# Patient Record
Sex: Male | Born: 1960 | Race: White | Hispanic: No | Smoking: Current every day smoker
Health system: Southern US, Community
[De-identification: ages and names within clinical notes are randomized; demographics above are authoritative.]

## PROBLEM LIST (undated history)

## (undated) DIAGNOSIS — C801 Malignant (primary) neoplasm, unspecified: Secondary | ICD-10-CM

## (undated) DIAGNOSIS — F1010 Alcohol abuse, uncomplicated: Secondary | ICD-10-CM

## (undated) DIAGNOSIS — K7460 Unspecified cirrhosis of liver: Secondary | ICD-10-CM

## (undated) HISTORY — PX: PARACENTESIS: SHX844

## (undated) HISTORY — PX: OTHER SURGICAL HISTORY: SHX169

## (undated) HISTORY — PX: KNEE SURGERY: SHX244

---

## 1998-04-24 ENCOUNTER — Emergency Department (HOSPITAL_COMMUNITY): Admission: EM | Admit: 1998-04-24 | Discharge: 1998-04-24 | Payer: Self-pay | Admitting: Emergency Medicine

## 1998-04-24 ENCOUNTER — Encounter: Payer: Self-pay | Admitting: Orthopedic Surgery

## 2003-03-07 ENCOUNTER — Emergency Department (HOSPITAL_COMMUNITY): Admission: EM | Admit: 2003-03-07 | Discharge: 2003-03-07 | Payer: Self-pay | Admitting: Emergency Medicine

## 2010-08-14 ENCOUNTER — Emergency Department (HOSPITAL_COMMUNITY)
Admission: EM | Admit: 2010-08-14 | Discharge: 2010-08-16 | Disposition: A | Discharge status: Home / Self Care | Payer: Self-pay | Attending: Emergency Medicine | Admitting: Emergency Medicine

## 2010-08-14 ENCOUNTER — Emergency Department (HOSPITAL_COMMUNITY): Payer: Self-pay

## 2010-08-14 DIAGNOSIS — I517 Cardiomegaly: Secondary | ICD-10-CM | POA: Insufficient documentation

## 2010-08-14 DIAGNOSIS — Z5900 Homelessness unspecified: Secondary | ICD-10-CM | POA: Insufficient documentation

## 2010-08-14 DIAGNOSIS — J4489 Other specified chronic obstructive pulmonary disease: Secondary | ICD-10-CM | POA: Insufficient documentation

## 2010-08-14 DIAGNOSIS — J449 Chronic obstructive pulmonary disease, unspecified: Secondary | ICD-10-CM | POA: Insufficient documentation

## 2010-08-14 DIAGNOSIS — R079 Chest pain, unspecified: Secondary | ICD-10-CM | POA: Insufficient documentation

## 2010-08-14 DIAGNOSIS — F1010 Alcohol abuse, uncomplicated: Secondary | ICD-10-CM | POA: Insufficient documentation

## 2010-08-14 DIAGNOSIS — R209 Unspecified disturbances of skin sensation: Secondary | ICD-10-CM | POA: Insufficient documentation

## 2010-08-14 LAB — CBC
HCT: 42.5 % (ref 39.0–52.0)
Hemoglobin: 14.6 g/dL (ref 13.0–17.0)
MCH: 32.8 pg (ref 26.0–34.0)
MCHC: 34.4 g/dL (ref 30.0–36.0)
MCV: 95.5 fL (ref 78.0–100.0)
Platelets: 140 10*3/uL — ABNORMAL LOW (ref 150–400)
RBC: 4.45 MIL/uL (ref 4.22–5.81)
RDW: 13.1 % (ref 11.5–15.5)
WBC: 5.9 10*3/uL (ref 4.0–10.5)

## 2010-08-14 LAB — DIFFERENTIAL
Basophils Absolute: 0.1 10*3/uL (ref 0.0–0.1)
Basophils Relative: 1 % (ref 0–1)
Eosinophils Absolute: 0 10*3/uL (ref 0.0–0.7)
Eosinophils Relative: 1 % (ref 0–5)
Lymphocytes Relative: 20 % (ref 12–46)
Lymphs Abs: 1.2 10*3/uL (ref 0.7–4.0)
Monocytes Absolute: 0.6 10*3/uL (ref 0.1–1.0)
Monocytes Relative: 10 % (ref 3–12)
Neutro Abs: 4 10*3/uL (ref 1.7–7.7)
Neutrophils Relative %: 68 % (ref 43–77)

## 2010-08-14 LAB — RAPID URINE DRUG SCREEN, HOSP PERFORMED
Amphetamines: NOT DETECTED
Barbiturates: NOT DETECTED
Benzodiazepines: NOT DETECTED
Cocaine: NOT DETECTED
Opiates: NOT DETECTED
Tetrahydrocannabinol: NOT DETECTED

## 2010-08-14 LAB — COMPREHENSIVE METABOLIC PANEL
ALT: 211 U/L — ABNORMAL HIGH (ref 0–53)
AST: 333 U/L — ABNORMAL HIGH (ref 0–37)
Albumin: 4 g/dL (ref 3.5–5.2)
Alkaline Phosphatase: 153 U/L — ABNORMAL HIGH (ref 39–117)
BUN: 5 mg/dL — ABNORMAL LOW (ref 6–23)
CO2: 20 mEq/L (ref 19–32)
Calcium: 9.4 mg/dL (ref 8.4–10.5)
Chloride: 99 mEq/L (ref 96–112)
Creatinine, Ser: 0.69 mg/dL (ref 0.50–1.35)
GFR calc Af Amer: >60 mL/min (ref >60)
GFR calc non Af Amer: >60 mL/min (ref >60)
Glucose, Bld: 96 mg/dL (ref 70–99)
Potassium: 3.8 mEq/L (ref 3.5–5.1)
Sodium: 134 mEq/L — ABNORMAL LOW (ref 135–145)
Total Bilirubin: 0.7 mg/dL (ref 0.3–1.2)
Total Protein: 8.1 g/dL (ref 6.0–8.3)

## 2010-08-14 LAB — ETHANOL: Alcohol, Ethyl (B): 301 mg/dL — ABNORMAL HIGH (ref 0–11)

## 2010-08-15 LAB — ETHANOL: Alcohol, Ethyl (B): <11 mg/dL (ref 0–11)

## 2010-08-19 ENCOUNTER — Emergency Department (HOSPITAL_COMMUNITY)
Admission: EM | Admit: 2010-08-19 | Discharge: 2010-08-20 | Disposition: A | Discharge status: Other Acute Inpatient Hospital | Payer: Self-pay | Attending: Emergency Medicine | Admitting: Emergency Medicine

## 2010-08-19 DIAGNOSIS — R45851 Suicidal ideations: Secondary | ICD-10-CM | POA: Insufficient documentation

## 2010-08-19 DIAGNOSIS — F1010 Alcohol abuse, uncomplicated: Secondary | ICD-10-CM | POA: Insufficient documentation

## 2010-08-19 LAB — RAPID URINE DRUG SCREEN, HOSP PERFORMED
Barbiturates: NOT DETECTED
Benzodiazepines: NOT DETECTED
Cocaine: NOT DETECTED
Opiates: NOT DETECTED
Tetrahydrocannabinol: NOT DETECTED

## 2010-08-19 LAB — CBC
HCT: 43.2 % (ref 39.0–52.0)
Hemoglobin: 14.9 g/dL (ref 13.0–17.0)
MCH: 33.2 pg (ref 26.0–34.0)
MCV: 96.2 fL (ref 78.0–100.0)
RBC: 4.49 MIL/uL (ref 4.22–5.81)
WBC: 6.8 10*3/uL (ref 4.0–10.5)

## 2010-08-19 LAB — COMPREHENSIVE METABOLIC PANEL
AST: 288 U/L — ABNORMAL HIGH (ref 0–37)
BUN: 5 mg/dL — ABNORMAL LOW (ref 6–23)
CO2: 22 mEq/L (ref 19–32)
Chloride: 102 mEq/L (ref 96–112)
Creatinine, Ser: 0.66 mg/dL (ref 0.50–1.35)
GFR calc Af Amer: >60 mL/min (ref >60)
GFR calc non Af Amer: >60 mL/min (ref >60)
Glucose, Bld: 95 mg/dL (ref 70–99)
Total Bilirubin: 0.7 mg/dL (ref 0.3–1.2)

## 2010-08-19 LAB — DIFFERENTIAL
Lymphocytes Relative: 25 % (ref 12–46)
Lymphs Abs: 1.7 10*3/uL (ref 0.7–4.0)
Monocytes Relative: 12 % (ref 3–12)
Neutrophils Relative %: 61 % (ref 43–77)

## 2010-08-19 LAB — URINALYSIS, ROUTINE W REFLEX MICROSCOPIC
Bilirubin Urine: NEGATIVE
Hgb urine dipstick: NEGATIVE
Nitrite: NEGATIVE
pH: 6 (ref 5.0–8.0)

## 2010-08-19 LAB — ETHANOL: Alcohol, Ethyl (B): 330 mg/dL — ABNORMAL HIGH (ref 0–11)

## 2010-08-20 ENCOUNTER — Inpatient Hospital Stay (HOSPITAL_COMMUNITY)
Admission: AD | Admit: 2010-08-20 | Discharge: 2010-08-24 | Disposition: A | Discharge status: Home / Self Care | Payer: PRIVATE HEALTH INSURANCE | Source: Ambulatory Visit | Attending: Psychiatry | Admitting: Psychiatry

## 2010-08-20 DIAGNOSIS — Z880 Allergy status to penicillin: Secondary | ICD-10-CM

## 2010-08-20 DIAGNOSIS — Z560 Unemployment, unspecified: Secondary | ICD-10-CM

## 2010-08-20 DIAGNOSIS — F1994 Other psychoactive substance use, unspecified with psychoactive substance-induced mood disorder: Secondary | ICD-10-CM

## 2010-08-20 DIAGNOSIS — Z5900 Homelessness unspecified: Secondary | ICD-10-CM

## 2010-08-20 DIAGNOSIS — F329 Major depressive disorder, single episode, unspecified: Secondary | ICD-10-CM

## 2010-08-20 DIAGNOSIS — G9349 Other encephalopathy: Secondary | ICD-10-CM

## 2010-08-20 DIAGNOSIS — F10951 Alcohol use, unspecified with alcohol-induced psychotic disorder with hallucinations: Secondary | ICD-10-CM

## 2010-08-20 DIAGNOSIS — M545 Low back pain: Secondary | ICD-10-CM

## 2010-08-20 DIAGNOSIS — F1020 Alcohol dependence, uncomplicated: Principal | ICD-10-CM

## 2010-08-20 DIAGNOSIS — R45851 Suicidal ideations: Secondary | ICD-10-CM

## 2010-08-20 DIAGNOSIS — F3289 Other specified depressive episodes: Secondary | ICD-10-CM

## 2010-08-20 DIAGNOSIS — F429 Obsessive-compulsive disorder, unspecified: Secondary | ICD-10-CM

## 2010-08-20 DIAGNOSIS — Z6379 Other stressful life events affecting family and household: Secondary | ICD-10-CM

## 2010-08-20 DIAGNOSIS — F1910 Other psychoactive substance abuse, uncomplicated: Secondary | ICD-10-CM

## 2010-08-20 DIAGNOSIS — K7010 Alcoholic hepatitis without ascites: Secondary | ICD-10-CM

## 2010-08-22 DIAGNOSIS — F1994 Other psychoactive substance use, unspecified with psychoactive substance-induced mood disorder: Secondary | ICD-10-CM

## 2010-08-22 DIAGNOSIS — F1020 Alcohol dependence, uncomplicated: Secondary | ICD-10-CM

## 2010-08-22 DIAGNOSIS — F1910 Other psychoactive substance abuse, uncomplicated: Secondary | ICD-10-CM

## 2010-08-24 ENCOUNTER — Inpatient Hospital Stay (HOSPITAL_COMMUNITY)
Admission: AD | Admit: 2010-08-24 | Discharge: 2010-09-03 | DRG: 896 | Disposition: A | Discharge status: Rehabilitation Facility | Payer: PRIVATE HEALTH INSURANCE | Source: Ambulatory Visit | Attending: Psychiatry | Admitting: Psychiatry

## 2010-08-25 LAB — HEPATIC FUNCTION PANEL
ALT: 197 U/L — ABNORMAL HIGH (ref 0–53)
Albumin: 4 g/dL (ref 3.5–5.2)
Alkaline Phosphatase: 107 U/L (ref 39–117)
Total Bilirubin: 0.9 mg/dL (ref 0.3–1.2)
Total Protein: 7.5 g/dL (ref 6.0–8.3)

## 2010-08-26 LAB — HEPATIC FUNCTION PANEL
ALT: 212 U/L — ABNORMAL HIGH (ref 0–53)
Alkaline Phosphatase: 108 U/L (ref 39–117)
Bilirubin, Direct: 0.2 mg/dL (ref 0.0–0.3)
Indirect Bilirubin: 0.5 mg/dL (ref 0.3–0.9)
Total Bilirubin: 0.7 mg/dL (ref 0.3–1.2)
Total Protein: 7.8 g/dL (ref 6.0–8.3)

## 2010-08-27 ENCOUNTER — Ambulatory Visit (HOSPITAL_COMMUNITY)
Admit: 2010-08-27 | Discharge: 2010-08-27 | Disposition: A | Discharge status: Home / Self Care | Payer: PRIVATE HEALTH INSURANCE | Attending: Psychiatry | Admitting: Psychiatry

## 2010-08-27 DIAGNOSIS — F29 Unspecified psychosis not due to a substance or known physiological condition: Secondary | ICD-10-CM | POA: Insufficient documentation

## 2010-08-27 DIAGNOSIS — R51 Headache: Secondary | ICD-10-CM | POA: Insufficient documentation

## 2010-08-27 LAB — HEPATIC FUNCTION PANEL
ALT: 230 U/L — ABNORMAL HIGH (ref 0–53)
AST: 191 U/L — ABNORMAL HIGH (ref 0–37)
Albumin: 3.8 g/dL (ref 3.5–5.2)
Bilirubin, Direct: 0.1 mg/dL (ref 0.0–0.3)
Indirect Bilirubin: 0.5 mg/dL (ref 0.3–0.9)
Total Bilirubin: 0.5 mg/dL (ref 0.3–1.2)
Total Protein: 7.5 g/dL (ref 6.0–8.3)
Total Protein: 7.3 g/dL (ref 6.0–8.3)

## 2010-08-27 LAB — AMMONIA: Ammonia: 45 umol/L (ref 11–60)

## 2010-08-27 MED ORDER — IOHEXOL 300 MG/ML  SOLN
100.0000 mL | Freq: Once | INTRAMUSCULAR | Status: AC | PRN
  Administered 2010-08-27: 100 mL via INTRAVENOUS

## 2010-08-29 LAB — APTT: aPTT: 36 seconds (ref 24–37)

## 2010-08-29 LAB — PROTIME-INR
INR: 0.94 (ref 0.00–1.49)
Prothrombin Time: 12.8 seconds (ref 11.6–15.2)

## 2010-08-30 LAB — HEPATIC FUNCTION PANEL
AST: 110 U/L — ABNORMAL HIGH (ref 0–37)
Albumin: 3.7 g/dL (ref 3.5–5.2)
Total Bilirubin: 0.5 mg/dL (ref 0.3–1.2)
Total Protein: 7.4 g/dL (ref 6.0–8.3)

## 2010-08-31 LAB — HEPATIC FUNCTION PANEL
AST: 97 U/L — ABNORMAL HIGH (ref 0–37)
Albumin: 3.9 g/dL (ref 3.5–5.2)
Alkaline Phosphatase: 97 U/L (ref 39–117)
Total Bilirubin: 0.4 mg/dL (ref 0.3–1.2)
Total Protein: 7.3 g/dL (ref 6.0–8.3)

## 2010-08-31 LAB — HEPATITIS PANEL, ACUTE
Hep B C IgM: NEGATIVE
Hepatitis B Surface Ag: NEGATIVE

## 2010-09-03 LAB — HEPATIC FUNCTION PANEL
ALT: 149 U/L — ABNORMAL HIGH (ref 0–53)
Albumin: 4.1 g/dL (ref 3.5–5.2)
Indirect Bilirubin: 0.4 mg/dL (ref 0.3–0.9)
Total Protein: 7.3 g/dL (ref 6.0–8.3)

## 2010-09-06 NOTE — Discharge Summary (Signed)
[name], [name] NO.:  [id number]  MEDICAL RECORD NO.:  [id number]  LOCATION:  0305                          FACILITY:  [facility]  PHYSICIAN:  Orson Aloe, MD       DATE OF BIRTH:  1960/09/09  DATE OF ADMISSION:  08/20/2010 DATE OF DISCHARGE:  09/03/2010                              DISCHARGE SUMMARY   REASON FOR HOSPITALIZATION:  This 50 year old married white male is not sure in fact if he is divorced or not.  Apparently he is homeless and bums money.  [place] He contacted mobile crisis stating that he needed help for finding a place for detox.  He was met at [facility]. He reported increasing depressive symptoms such as obsessing over past and thinking about things he regrets.  He is tired of being drunk all the time.  He states that if he did not find help, he might kill himself overnight.  He admitted to not caring about his life and wanting to be dead.  He states he will feel better if he was dead.  His admitting alcohol level was 330.  Positive physical findings:  He has no abnormalities of the CBC. His SGOT was elevated at 288 as expected and SGPT was 266.  His UDS was negative.  No other labs were drawn.  DISCHARGE DIAGNOSIS:  Axis I:  Alcohol hallucinosis, inhalant abuse, alcohol abuse, history of obsessive compulsive disorder, substance- induced mood disorder. Axis II: Deferred. 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 Axis IV:  Severe, housing, occupational and financial. Axis V:  45.  SIGNIFICANT FINDINGS/HOSPITAL COURSE:  He had some clearing that seemed associated with him starting on some Risperdal one day but then he had further difficulty with his mentation.  It was associated at a time in which his liver enzymes went from 141 and 197 up to 191 and 256 from August 4 to August 6.  Consultation with GI recommended that since it was below 300,  we would not worry too much about it but get PT and PTT which was in the normal range as well as ammonia which was 45, also in the normal range.  On discharge his SGOT was 103 and SGPT was 149.  It was felt when they both got below 100, that he probably would benefit from being tried on Tegretol.  During hospital stay, he was able to recognize several triggers, one of which was that when he goes to [place], he wants to drink and he became very belligerent and cursing when he was forced to attend one karaoke session and at the time of the next karaoke session, he asked to be relieved of that and an order was written for him to not have to go.  Furthermore, during the hospital course, a CT of the head with and without contrast revealed no acute pathology.  He also on his own asked for a big book and started reading that himself.  He did start some Celexa 20 mg for depression and OCD with some benefit noted.  Seroquel 200 mg for sleep was ordered and he did  seem to sleep better with that. Ensure seem to help him be able to reestablished some good nutrition during hospital stay.  CONDITION ON DISCHARGE:  The patient is seen today, felt ready for [facility] program.  Denies any acute auditory or visualizations, suicidal intent or homicidal intent.  No psychoses.  He sees alcohol as his issue and needs help with that.  DISCHARGE STATUS:  Improved.  INSTRUCTIONS:  Resume typical activity and diet.  Attend [facility] on Monday morning August 13 and a door-to- door cab was arranged for him as he knew he was not able to make it past the first gas station.  DISCHARGE MEDICATIONS: 1. Celexa 20 mg a day. 2. Nicotine patch 21 mg a day. 3. Protonix 40 mg a day. 4. Seroquel 200  mg at bedtime. 5. Vitamin B1 100. 6. Seroquel 25 mg twice a day.          ______________________________ Orson Aloe, MD     EW/MEDQ  D:  09/04/2010  T:  09/05/2010  Job:  [id number]  Electronically  Signed by Orson Aloe  on 09/06/2010 12:14:51 PM

## 2010-10-06 ENCOUNTER — Other Ambulatory Visit: Payer: Self-pay

## 2010-10-06 ENCOUNTER — Emergency Department (INDEPENDENT_AMBULATORY_CARE_PROVIDER_SITE_OTHER): Payer: Self-pay

## 2010-10-06 ENCOUNTER — Emergency Department (HOSPITAL_BASED_OUTPATIENT_CLINIC_OR_DEPARTMENT_OTHER)
Admission: EM | Admit: 2010-10-06 | Discharge: 2010-10-07 | Disposition: A | Discharge status: Home / Self Care | Payer: Self-pay | Attending: Emergency Medicine | Admitting: Emergency Medicine

## 2010-10-06 ENCOUNTER — Encounter: Payer: Self-pay | Admitting: *Deleted

## 2010-10-06 DIAGNOSIS — R059 Cough, unspecified: Secondary | ICD-10-CM | POA: Insufficient documentation

## 2010-10-06 DIAGNOSIS — R188 Other ascites: Secondary | ICD-10-CM

## 2010-10-06 DIAGNOSIS — T148XXA Other injury of unspecified body region, initial encounter: Secondary | ICD-10-CM

## 2010-10-06 DIAGNOSIS — K8020 Calculus of gallbladder without cholecystitis without obstruction: Secondary | ICD-10-CM

## 2010-10-06 DIAGNOSIS — F17200 Nicotine dependence, unspecified, uncomplicated: Secondary | ICD-10-CM | POA: Insufficient documentation

## 2010-10-06 DIAGNOSIS — J984 Other disorders of lung: Secondary | ICD-10-CM

## 2010-10-06 DIAGNOSIS — R079 Chest pain, unspecified: Secondary | ICD-10-CM | POA: Insufficient documentation

## 2010-10-06 DIAGNOSIS — R05 Cough: Secondary | ICD-10-CM

## 2010-10-06 DIAGNOSIS — M25519 Pain in unspecified shoulder: Secondary | ICD-10-CM | POA: Insufficient documentation

## 2010-10-06 HISTORY — DX: Alcohol abuse, uncomplicated: F10.10

## 2010-10-06 HISTORY — DX: Malignant (primary) neoplasm, unspecified: C80.1

## 2010-10-06 MED ORDER — NITROGLYCERIN 0.4 MG SL SUBL
0.4000 mg | SUBLINGUAL_TABLET | SUBLINGUAL | Status: DC | PRN
  Filled 2010-10-06: qty 25

## 2010-10-06 MED ORDER — ASPIRIN 325 MG PO TABS
325.0000 mg | ORAL_TABLET | ORAL | Status: AC
  Administered 2010-10-07: 325 mg via ORAL
  Filled 2010-10-06: qty 1

## 2010-10-06 NOTE — ED Notes (Signed)
Pt brought to ED via EMS from [facility]. C/O midsternal CP radiating to left shoulder onset yesterday. "Hard to get a deep breath". Also states he has had a cough. Denies other s/s.

## 2010-10-06 NOTE — ED Provider Notes (Addendum)
Scribed for Phillip Baker, MD, the patient was seen in room MH05/MH05 . This chart was scribed by Ellie Lunch. This patient's care was started at 11:45 PM.   CSN: [id number] Arrival date & time: 10/06/2010 10:44 PM   Chief Complaint  Patient presents with  . Chest Pain    (Include location/radiation/quality/duration/timing/severity/associated sxs/prior treatment) HPI [name] is a 50 y.o. male who presents to the Emergency Department complaining of constant left chest pain starting last night that began to radiate to left shoulder and arm this morning. Pt was watching tv last night when pain onset. Denies SOB, diaphoresis. C/o associated dry cough. Shoulder and arm pain aggravated by arm movement and position. Improvement on rest. Chest pain is improved by nothing.  Pt is at [facility] for alcohol detox for the last 40 days.  No reported Heart problems.   Past Medical History  Diagnosis Date  . Alcohol abuse   . Cancer     Past Surgical History  Procedure Date  . Arm surgery   . Knee surgery     History reviewed. No pertinent family history.  History  Substance Use Topics  . Smoking status: Current Everyday Smoker  . Smokeless tobacco: Not on file  . Alcohol Use: Yes    Review of Systems 10 Systems reviewed and are negative for acute change except as noted in the HPI.   Allergies  Penicillins  Home Medications   Current Outpatient Rx  Name Route Sig Dispense Refill  . CITALOPRAM HYDROBROMIDE 20 MG PO TABS Oral Take 20 mg by mouth daily.      Marland Kitchen ONE-DAILY MULTI VITAMINS PO TABS Oral Take 1 tablet by mouth daily.      Marland Kitchen RISPERIDONE 3 MG PO TABS Oral Take 3 mg by mouth at bedtime.      . THIAMINE HCL 100 MG PO TABS Oral Take 100 mg by mouth daily.      . TRAZODONE HCL 100 MG PO TABS Oral Take 200 mg by mouth at bedtime.        Physical Exam    BP 150/76  Pulse 72  Temp(Src) 98.5 F (36.9 C) (Oral)  Resp 20  Ht 5\' 7"  (1.702 m)  Wt 180 lb (81.647 kg)   BMI 28.19 kg/m2  SpO2 100%  Physical Exam  Nursing note and vitals reviewed. Constitutional: He is oriented to person, place, and time. He appears well-developed and well-nourished.  HENT:  Head: Normocephalic and atraumatic.  Eyes: Conjunctivae and EOM are normal.  Neck: Normal range of motion. Neck supple.  Cardiovascular: Normal rate, regular rhythm and normal heart sounds.   Pulmonary/Chest: Effort normal. He has wheezes (bilateral faint expiratory). He exhibits no tenderness.  Abdominal: Soft. There is no tenderness.  Musculoskeletal: Normal range of motion.       Left upper extremity tenderness with ROM.   Neurological: He is alert and oriented to person, place, and time.  Skin: Skin is warm and dry. No rash noted.   Procedures  OTHER DATA REVIEWED: Nursing notes, vital signs, and past medical records reviewed.  DIAGNOSTIC STUDIES: Oxygen Saturation is 100% on room air, normal by my interpretation.    LABS / RADIOLOGY:  Dg Chest 2 View  10/07/2010  *RADIOLOGY REPORT*  Clinical Data: Constant left-sided chest pain, radiating to the left shoulder and arm.  Dry cough.  CHEST - 2 VIEW  Comparison: Chest radiograph performed 08/14/2010  Findings: The lungs are well-aerated and clear.  There is no evidence  of focal opacification, pleural effusion or pneumothorax.  The heart is borderline normal in size; the mediastinal contour is within normal limits.  No acute osseous abnormalities are seen. The visualized portions of the left shoulder are grossly unremarkable.  IMPRESSION: No acute cardiopulmonary process seen; no displaced rib fractures identified.  Original Report Authenticated By: Tonia Ghent, M.D.   Ct Angio Chest W/cm &/or Wo Cm  10/07/2010  *RADIOLOGY REPORT*  Clinical Data:  Constant left-sided chest pain, radiating to the left shoulder and arm.  Dry cough.  CT ANGIOGRAPHY CHEST WITH CONTRAST  Technique:  Multidetector CT imaging of the chest was performed using the standard  protocol during bolus administration of intravenous contrast.  Multiplanar CT image reconstructions including MIPs were obtained to evaluate the vascular anatomy.  Contrast:  80 mL of Omnipaque 350 IV contrast  Comparison:  Chest radiograph performed earlier today at 12:52 a.m.  Findings:  There is no evidence of pulmonary embolus.  A centrally calcified 1.0 cm nodule is noted at the medial aspect of the right lung apex (image 36 of 106); this is likely benign given its appearance.  Additional tiny nodules are seen within both lungs, measuring up to 5 mm in size (images 43, 50, 64 and 68); these are most likely post infectious in nature.  The lungs are otherwise clear.  There is no evidence of significant focal consolidation, pleural effusion or pneumothorax.  No masses are identified; no abnormal focal contrast enhancement is seen.  No mediastinal lymphadenopathy is seen. There is incidental direct origin of the left vertebral artery from the aortic arch.  The great vessels are unremarkable in appearance.  No pericardial effusion is seen.  No axillary lymphadenopathy is seen.  The thyroid gland is unremarkable in appearance.  The visualized portions of the liver and spleen are unremarkable. Apparent stones are noted at the base of the minimally visualized gallbladder.  No acute osseous abnormalities are seen.  Review of the MIP images confirms the above findings.  IMPRESSION:  1.  No evidence of pulmonary embolus. 2.  Scattered tiny nodules within both lungs, measuring up to 5 mm in size, are most likely post infectious in nature. 3.  Centrally calcified 1.0 cm nodule at the medial aspect of the right lung apex is likely benign in its appearance, and likely reflects prior granulomatous disease. 4.  Apparent cholelithiasis, though the gallbladder is only minimally visualized.  Original Report Authenticated By: Tonia Ghent, M.D.     ED COURSE / COORDINATION OF CARE: Results for orders placed during the  hospital encounter of 10/06/10  CBC      Component Value Range   WBC 5.1  4.0 - 10.5 (K/uL)   RBC 3.97 (*) 4.22 - 5.81 (MIL/uL)   Hemoglobin 13.1  13.0 - 17.0 (g/dL)   HCT 04.5 (*) 40.9 - 52.0 (%)   MCV 93.2  78.0 - 100.0 (fL)   MCH 33.0  26.0 - 34.0 (pg)   MCHC 35.4  30.0 - 36.0 (g/dL)   RDW 81.1 (*) 91.4 - 15.5 (%)   Platelets 103 (*) 150 - 400 (K/uL)  BASIC METABOLIC PANEL      Component Value Range   Sodium 137  135 - 145 (mEq/L)   Potassium 3.9  3.5 - 5.1 (mEq/L)   Chloride 102  96 - 112 (mEq/L)   CO2 25  19 - 32 (mEq/L)   Glucose, Bld 109 (*) 70 - 99 (mg/dL)   BUN 8  6 - 23 (mg/dL)  Creatinine, Ser 0.80  0.50 - 1.35 (mg/dL)   Calcium 95.2  8.4 - 10.5 (mg/dL)   GFR calc non Af Amer >60  >60 (mL/min)   GFR calc Af Amer >60  >60 (mL/min)  CARDIAC PANEL(CRET KIN+CKTOT+MB+TROPI)      Component Value Range   Total CK 160  7 - 232 (U/L)   CK, MB 1.7  0.3 - 4.0 (ng/mL)   Troponin I <0.30  <0.30 (ng/mL)   Relative Index 1.1  0.0 - 2.5   D-DIMER, QUANTITATIVE      Component Value Range   D-Dimer, Quant 1.00 (*) 0.00 - 0.48 (ug/mL-FEU)  TROPONIN I      Component Value Range   Troponin I <0.30  <0.30 (ng/mL)      MDM:  Date: 10/07/2010  Rate: 72  Rhythm: sinus arrhythmia  QRS Axis: normal  Intervals: normal  ST/T Wave abnormalities: normal  Conduction Disutrbances:none  Narrative Interpretation:   Old EKG Reviewed: none available 3:31 AM Pt with neg chest ct ( d-dimer was elevated), will check delta trop and if neg, d/c--no concern for acs at this time  Patient given Toradol and now feels better Patient to be [facility] to [facility]  SCRIBE ATTESTATION: I personally performed the services described in this documentation, which was scribed in my presence. The recorded information has been reviewed and considered. Phillip Baker, MD            Phillip Baker, MD 10/07/10 8413  Phillip Baker, MD 10/07/10 0430

## 2010-10-06 NOTE — ED Notes (Signed)
See previous note

## 2010-10-07 ENCOUNTER — Emergency Department (INDEPENDENT_AMBULATORY_CARE_PROVIDER_SITE_OTHER): Payer: Self-pay

## 2010-10-07 DIAGNOSIS — R05 Cough: Secondary | ICD-10-CM

## 2010-10-07 DIAGNOSIS — R188 Other ascites: Secondary | ICD-10-CM

## 2010-10-07 LAB — CBC
Hemoglobin: 13.1 g/dL (ref 13.0–17.0)
MCH: 33 pg (ref 26.0–34.0)
Platelets: 103 10*3/uL — ABNORMAL LOW (ref 150–400)
RBC: 3.97 MIL/uL — ABNORMAL LOW (ref 4.22–5.81)
WBC: 5.1 10*3/uL (ref 4.0–10.5)

## 2010-10-07 LAB — D-DIMER, QUANTITATIVE: D-Dimer, Quant: 1 ug/mL-FEU — ABNORMAL HIGH (ref 0.00–0.48)

## 2010-10-07 LAB — CARDIAC PANEL(CRET KIN+CKTOT+MB+TROPI)
CK, MB: 1.7 ng/mL (ref 0.3–4.0)
Relative Index: 1.1 (ref 0.0–2.5)
Total CK: 160 U/L (ref 7–232)
Troponin I: <0.3 ng/mL (ref <0.30)

## 2010-10-07 LAB — BASIC METABOLIC PANEL
CO2: 25 mEq/L (ref 19–32)
Calcium: 10.1 mg/dL (ref 8.4–10.5)
Chloride: 102 mEq/L (ref 96–112)
Glucose, Bld: 109 mg/dL — ABNORMAL HIGH (ref 70–99)
Sodium: 137 mEq/L (ref 135–145)

## 2010-10-07 LAB — TROPONIN I: Troponin I: <0.3 ng/mL (ref <0.30)

## 2010-10-07 MED ORDER — KETOROLAC TROMETHAMINE 30 MG/ML IJ SOLN
30.0000 mg | Freq: Once | INTRAMUSCULAR | Status: AC
  Administered 2010-10-07: 30 mg via INTRAVENOUS
  Filled 2010-10-07: qty 1

## 2010-10-07 MED ORDER — IOHEXOL 350 MG/ML SOLN
80.0000 mL | Freq: Once | INTRAVENOUS | Status: AC | PRN
  Administered 2010-10-07: 80 mL via INTRAVENOUS

## 2010-10-07 NOTE — ED Notes (Signed)
EKG completed at 2252 9/15

## 2010-10-07 NOTE — ED Notes (Signed)
Pt to xray and returned
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
## 2011-03-17 ENCOUNTER — Emergency Department (HOSPITAL_COMMUNITY): Payer: Self-pay

## 2011-03-17 ENCOUNTER — Encounter (HOSPITAL_COMMUNITY): Payer: Self-pay | Admitting: *Deleted

## 2011-03-17 ENCOUNTER — Other Ambulatory Visit: Payer: Self-pay

## 2011-03-17 ENCOUNTER — Emergency Department (HOSPITAL_COMMUNITY)
Admission: EM | Admit: 2011-03-17 | Discharge: 2011-03-18 | Disposition: A | Discharge status: Home / Self Care | Payer: Self-pay | Attending: Emergency Medicine | Admitting: Emergency Medicine

## 2011-03-17 DIAGNOSIS — F17200 Nicotine dependence, unspecified, uncomplicated: Secondary | ICD-10-CM | POA: Insufficient documentation

## 2011-03-17 DIAGNOSIS — Z79899 Other long term (current) drug therapy: Secondary | ICD-10-CM | POA: Insufficient documentation

## 2011-03-17 DIAGNOSIS — R079 Chest pain, unspecified: Secondary | ICD-10-CM | POA: Insufficient documentation

## 2011-03-17 DIAGNOSIS — F1020 Alcohol dependence, uncomplicated: Secondary | ICD-10-CM

## 2011-03-17 DIAGNOSIS — F10229 Alcohol dependence with intoxication, unspecified: Secondary | ICD-10-CM | POA: Insufficient documentation

## 2011-03-17 LAB — COMPREHENSIVE METABOLIC PANEL
AST: 72 U/L — ABNORMAL HIGH (ref 0–37)
Alkaline Phosphatase: 78 U/L (ref 39–117)
BUN: 5 mg/dL — ABNORMAL LOW (ref 6–23)
CO2: 20 mEq/L (ref 19–32)
Chloride: 101 mEq/L (ref 96–112)
Creatinine, Ser: 0.77 mg/dL (ref 0.50–1.35)
GFR calc non Af Amer: >90 mL/min (ref >90)
Total Bilirubin: 0.4 mg/dL (ref 0.3–1.2)

## 2011-03-17 LAB — CBC
HCT: 49 % (ref 39.0–52.0)
MCV: 93.5 fL (ref 78.0–100.0)
RBC: 5.24 MIL/uL (ref 4.22–5.81)
WBC: 9.7 10*3/uL (ref 4.0–10.5)

## 2011-03-17 LAB — RAPID URINE DRUG SCREEN, HOSP PERFORMED: Barbiturates: NOT DETECTED

## 2011-03-17 LAB — URINALYSIS, ROUTINE W REFLEX MICROSCOPIC
Glucose, UA: NEGATIVE mg/dL
Hgb urine dipstick: NEGATIVE
Specific Gravity, Urine: 1.005 (ref 1.005–1.030)
pH: 6 (ref 5.0–8.0)

## 2011-03-17 LAB — AMMONIA: Ammonia: 22 umol/L (ref 11–60)

## 2011-03-17 LAB — ETHANOL: Alcohol, Ethyl (B): 247 mg/dL — ABNORMAL HIGH (ref 0–11)

## 2011-03-17 MED ORDER — ONDANSETRON HCL 8 MG PO TABS: 4.0000 mg | ORAL_TABLET | Freq: Three times a day (TID) | ORAL | Status: DC | PRN

## 2011-03-17 MED ORDER — ACETAMINOPHEN 325 MG PO TABS: 650.0000 mg | ORAL_TABLET | ORAL | Status: DC | PRN

## 2011-03-17 MED ORDER — LISINOPRIL 10 MG PO TABS
10.0000 mg | ORAL_TABLET | Freq: Every day | ORAL | Status: DC
  Filled 2011-03-17: qty 1

## 2011-03-17 MED ORDER — CITALOPRAM HYDROBROMIDE 20 MG PO TABS
20.0000 mg | ORAL_TABLET | Freq: Every day | ORAL | Status: DC
  Filled 2011-03-17: qty 1

## 2011-03-17 MED ORDER — QUETIAPINE FUMARATE 100 MG PO TABS
100.0000 mg | ORAL_TABLET | Freq: Three times a day (TID) | ORAL | Status: DC
  Filled 2011-03-17 (×3): qty 1

## 2011-03-17 MED ORDER — IBUPROFEN 200 MG PO TABS: 600.0000 mg | ORAL_TABLET | Freq: Three times a day (TID) | ORAL | Status: DC | PRN

## 2011-03-17 MED ORDER — RISPERIDONE 3 MG PO TABS
3.0000 mg | ORAL_TABLET | Freq: Every day | ORAL | Status: DC
  Filled 2011-03-17: qty 1

## 2011-03-17 NOTE — ED Notes (Signed)
The pt is here for multiple complaints. He admits toi drinking alcohol and he is c/o chest pain and he wants to be committed laughing joking

## 2011-03-17 NOTE — ED Notes (Signed)
Pt report given and care endorsed to Damon, RN 

## 2011-03-17 NOTE — ED Provider Notes (Addendum)
History     CSN: [id number]  Arrival date & time 03/17/11  2045   First MD Initiated Contact with Patient 03/17/11 2115      Chief Complaint  Patient presents with  . Psychiatric Evaluation   level V caveat due to intoxication.  (Consider location/radiation/quality/duration/timing/severity/associated sxs/prior treatment) The history is provided by the patient.   patient wants to go for behavioral health. He states he's been drinking. He also states he has some chest pain.Marland Kitchen He states that he has a lot of reasons for behavioral health. He states he'll go her there and lie in the parking lot. He appears intoxicated  Past Medical History  Diagnosis Date  . Alcohol abuse   . Cancer     Past Surgical History  Procedure Date  . Arm surgery   . Knee surgery     History reviewed. No pertinent family history.  History  Substance Use Topics  . Smoking status: Current Everyday Smoker  . Smokeless tobacco: Not on file  . Alcohol Use: Yes      Review of Systems  Unable to perform ROS: Psychiatric disorder    Allergies  Penicillins  Home Medications   Current Outpatient Rx  Name Route Sig Dispense Refill  . CITALOPRAM HYDROBROMIDE 20 MG PO TABS Oral Take 20 mg by mouth daily.     Marland Kitchen LISINOPRIL 10 MG PO TABS Oral Take 10 mg by mouth daily.    . QUETIAPINE FUMARATE 100 MG PO TABS Oral Take 100 mg by mouth 3 (three) times daily.    Marland Kitchen RISPERIDONE 3 MG PO TABS Oral Take 3 mg by mouth at bedtime.        BP 141/99  Pulse 94  Temp(Src) 97.3 F (36.3 C) (Oral)  Resp 20  SpO2 97%  Physical Exam  Nursing note and vitals reviewed. Constitutional: He appears well-developed and well-nourished.  HENT:  Head: Normocephalic and atraumatic.  Eyes: EOM are normal. Pupils are equal, round, and reactive to light.  Neck: Normal range of motion. Neck supple.  Cardiovascular: Normal rate, regular rhythm and normal heart sounds.   No murmur heard. Pulmonary/Chest: Effort normal.     Mildly harsh breath sounds throughout  Abdominal: Soft. Bowel sounds are normal. He exhibits no distension and no mass. There is no tenderness. There is no rebound and no guarding.  Musculoskeletal: Normal range of motion. He exhibits no edema.  Neurological: He is alert. No cranial nerve deficit.       Patient appears intoxicated  Skin: Skin is warm and dry.  Psychiatric: He has a normal mood and affect.    ED Course  Procedures (including critical care time)  Labs Reviewed  URINALYSIS, ROUTINE W REFLEX MICROSCOPIC - Abnormal; Notable for the following:    Color, Urine STRAW (*)    Leukocytes, UA SMALL (*)    All other components within normal limits  URINE MICROSCOPIC-ADD ON  URINE RAPID DRUG SCREEN (HOSP PERFORMED)  CBC  COMPREHENSIVE METABOLIC PANEL  ETHANOL  AMMONIA   No results found.   No diagnosis found.   Date: 03/17/2011  Rate: 80   Rhythm: normal sinus rhythm  QRS Axis: normal  Intervals: normal  ST/T Wave abnormalities: normal  Conduction Disutrbances: none  Narrative Interpretation:   Old EKG Reviewed: unchanged No significant changes noted    MDM  Patient states he wants to go to behavioral health. He's been drinking. He won't say why he wants to go. He states he has many reasons. His  EKG is normal. Is also complaining of chest pain. His been involuntarily committed since he stated that he would grab the police officer's gun distally to go to behavioral health. He appears intoxicated this time. She'll need to be evaluated after he is sober.  Juliet Rude. Rubin Payor, MD 03/17/11 2332  Juliet Rude. Rubin Payor, MD 03/17/11 [phone]

## 2011-03-17 NOTE — ED Notes (Signed)
Pt states that he is trying to get to [facility]. Reports suicidal ideation with a plan to "get that police officer's gun right there and blow my brains out."  Informed pt that he was in the right place and that the doctor would have to see him and get some bloodwork completed.  Asked pt to change into paper scrubs and he said that he did not like them.  Pt laughing.  Informed pt again that we needed him to change into paper scrubs per protocol and that we could get him some help.  Pt walked out of room and said he needed to find the exit.  Pt continues to walk.  Redirected pt to room 18 and he would not go into room.  States again that he needed exit.  [employer] officer, Consulting civil engineer, security, EDP, and multiple staff members talking to pt in CDU as he was trying to leave.  Dr. Rubin Payor spoke with pt and pt sent back to room 18.

## 2011-03-18 ENCOUNTER — Encounter (HOSPITAL_COMMUNITY): Payer: Self-pay | Admitting: *Deleted

## 2011-03-18 MED ORDER — ZIPRASIDONE MESYLATE 20 MG IM SOLR: 20.0000 mg | INTRAMUSCULAR | Status: DC | PRN

## 2011-03-18 NOTE — ED Notes (Signed)
Pt to shower room on 2500 with security and sitter.

## 2011-03-18 NOTE — ED Notes (Signed)
Patient was offered another set for paper scrubs (since his are ripped), but the patient refuses to answer.

## 2011-03-18 NOTE — ED Notes (Signed)
Pt report given and care endorsed to Damon, RN

## 2011-03-18 NOTE — ED Notes (Signed)
Pt requesting to take his own medication so he isn't charged for meds from the hospital. Per Deretha Emory MD, ok for patient to take home medications. Pt medicated with 10mg  lisinopril. Pt refusing to take celexa until discharged.

## 2011-03-18 NOTE — ED Notes (Signed)
Pt d/c'd with belongings. Stable gait to d/c window. Pt verbalized understanding of d/c instructions prior to leaving. Sitter released.

## 2011-03-18 NOTE — ED Notes (Signed)
Pt moved room 28 for telepsych consult. Pt denies suicidal or homicidal ideation to this RN. Pt with no complaints at this time.

## 2011-03-18 NOTE — ED Provider Notes (Signed)
Patient seen and interviewed by telemedicine psychiatry Dr. Blair Hailey her has cleared the patient DC'd his IVC patient is no longer suicidal can be discharged home.    Phillip Jakes, MD 03/18/11 [phone]

## 2011-03-18 NOTE — ED Notes (Signed)
Patient is refusing to take his oral meds.  Advised MD.  MD stated he was okay with this.  MD ordered Geodon in case the patient becomes aggressive.

## 2011-03-18 NOTE — ED Notes (Signed)
Patient was asked if he wants to take his meds so he can sleep, but he refuses toanswer.
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
## 2013-07-11 IMAGING — CT CT HEAD WO/W CM
1 of 3 series · 13 of 30 positions shown, 17 images · IV contrast (APPLIED)
Comparison: None.

CLINICAL DATA: Confusion.  Headache.

CT HEAD WITHOUT AND WITH CONTRAST
TECHNIQUE: Contiguous axial images were obtained from the base of
the skull through the vertex without and with intravenous contrast.
Contrast: 100 ml Dmnipaque-THH

[Series 6: head_seq 4.5 h37s st · axial · 0.43mm/px · z∈[+1190,+1325]mm · 13 of 36 slices shown, 17 images]
[im 3/36  brain]
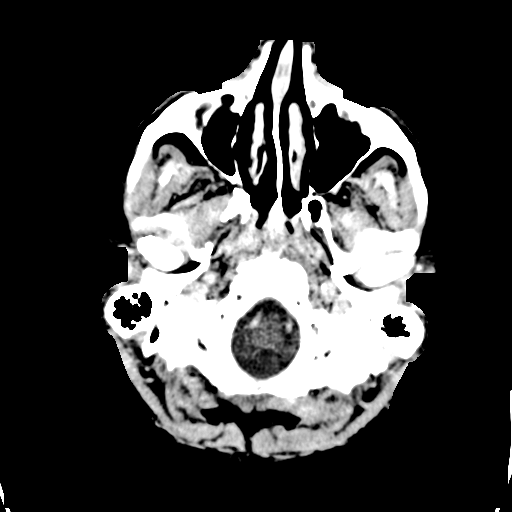
[im 3/36  bone]
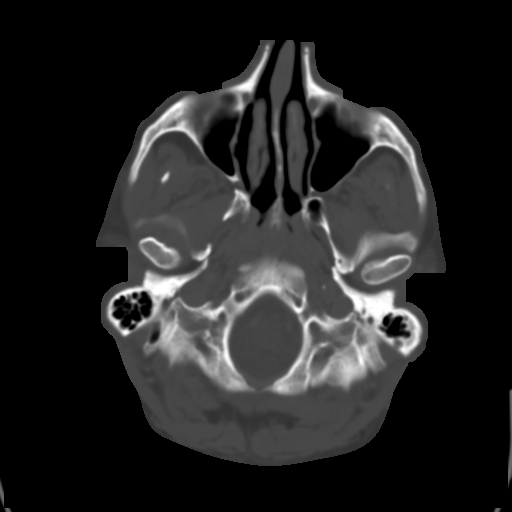
[im 6/36  brain]
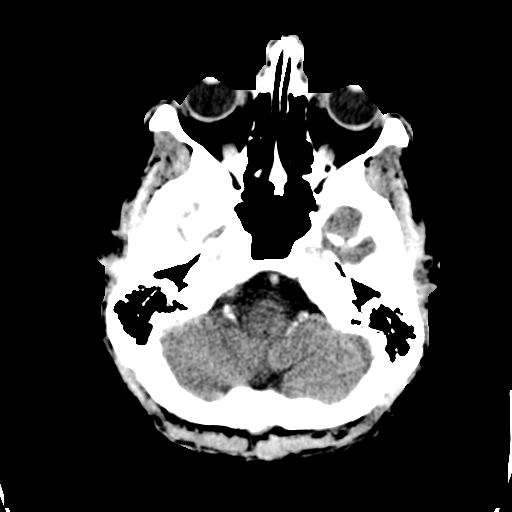
[im 8/36  brain]
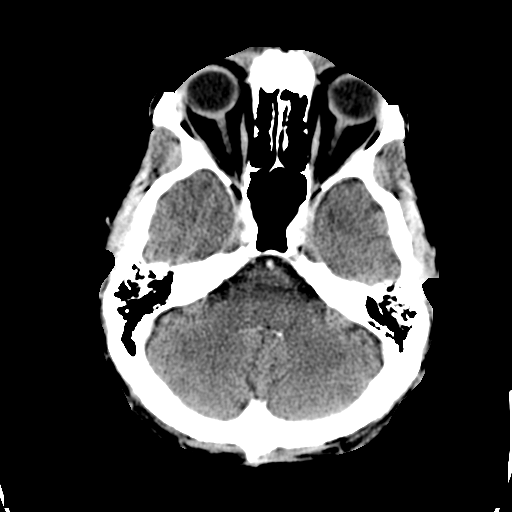
[im 11/36  brain]
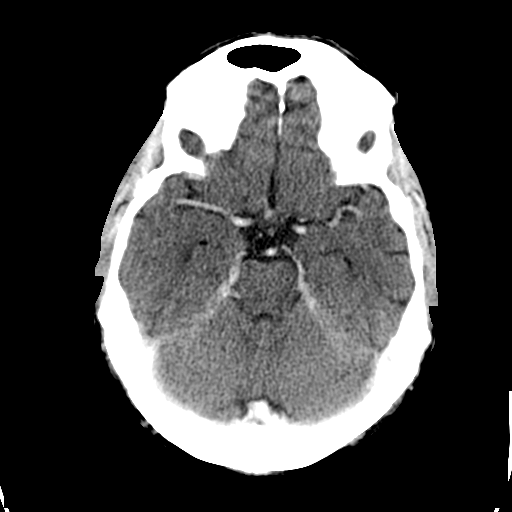
[im 13/36  brain]
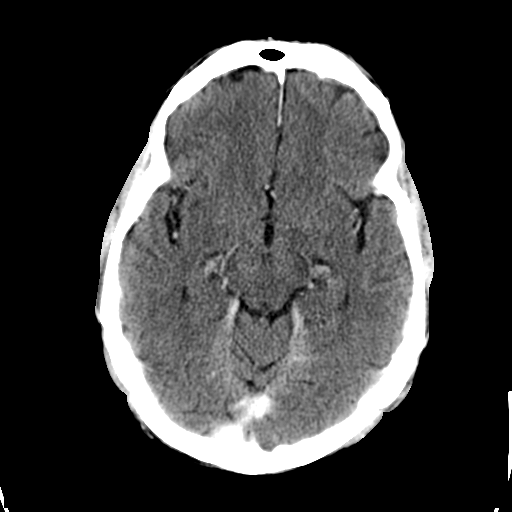
[im 13/36  bone]
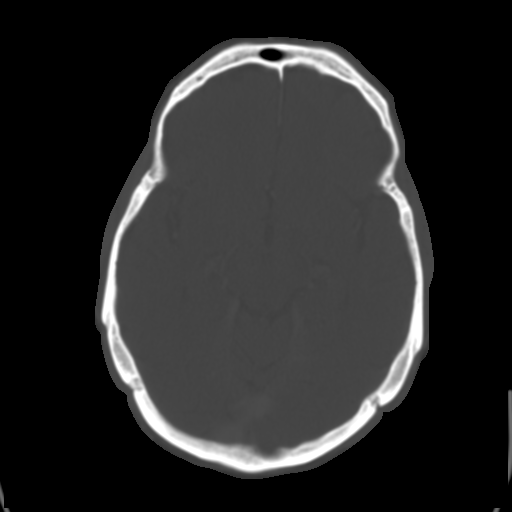
[im 16/36  brain]
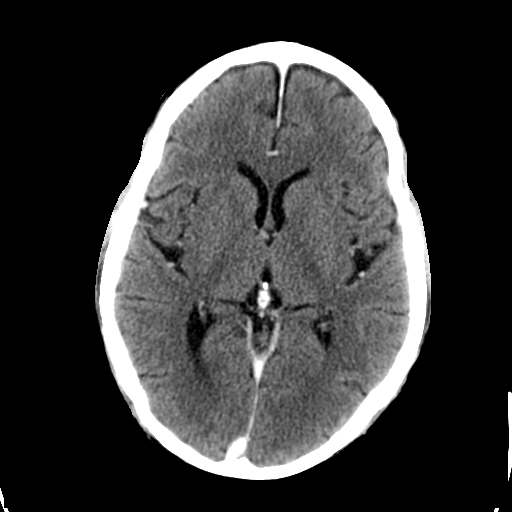
[im 18/36  brain]
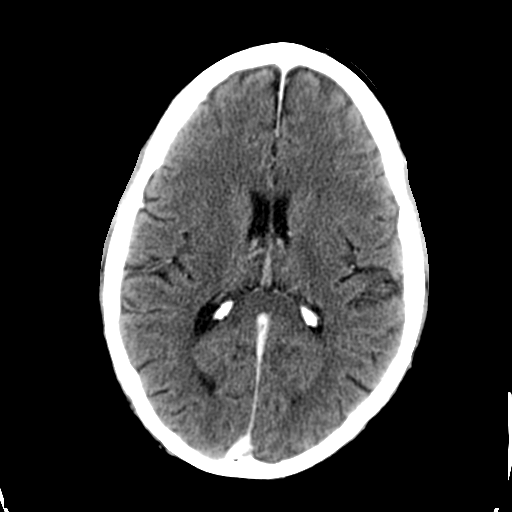
[im 21/36  brain]
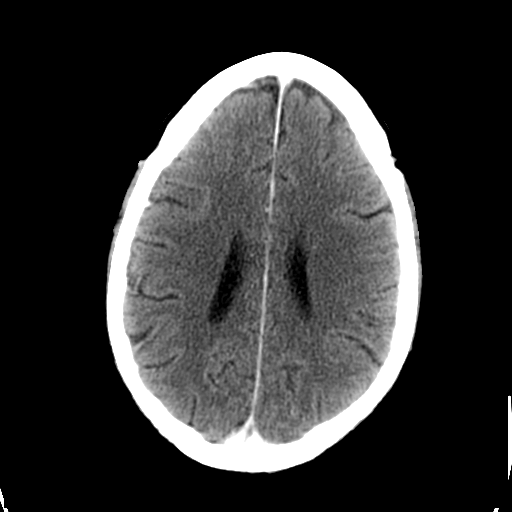
[im 23/36  brain]
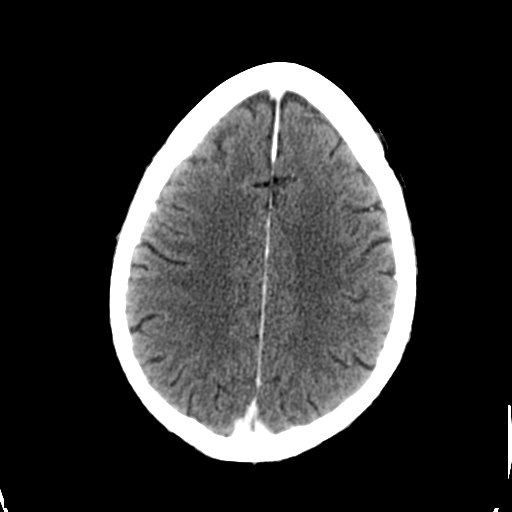
[im 23/36  bone]
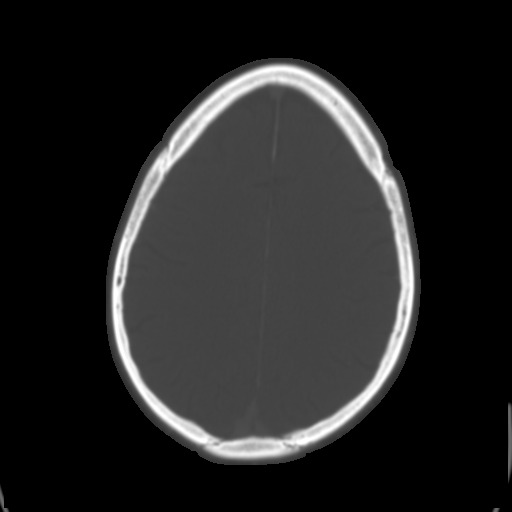
[im 26/36  brain]
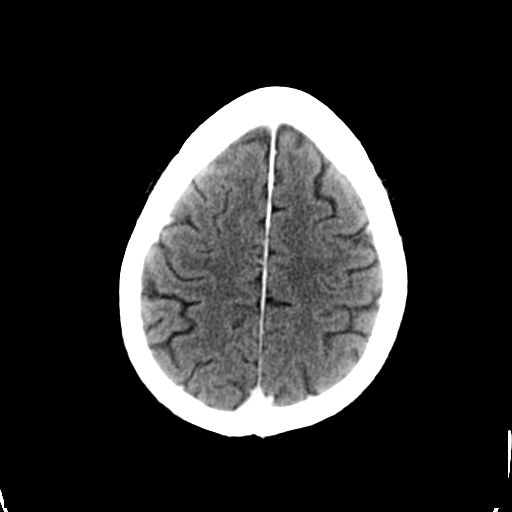
[im 28/36  brain]
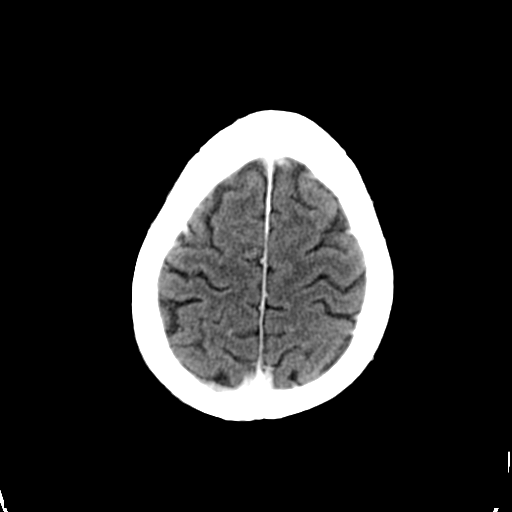
[im 31/36  brain]
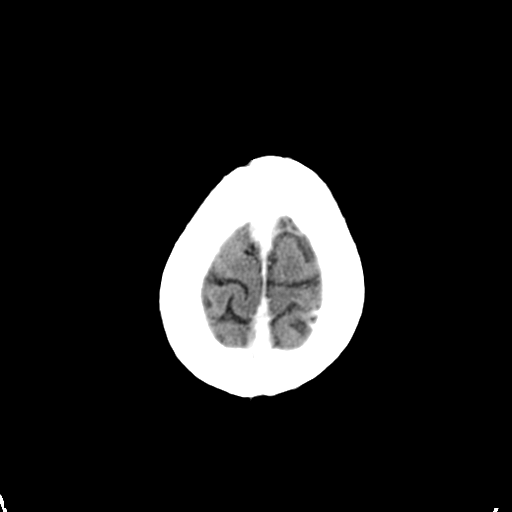
[im 33/36  brain]
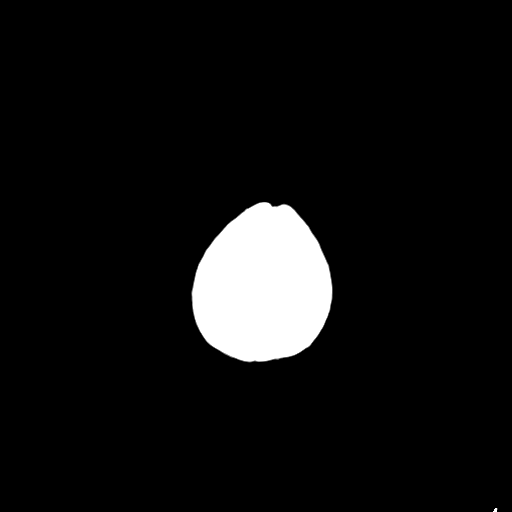
[im 33/36  bone]
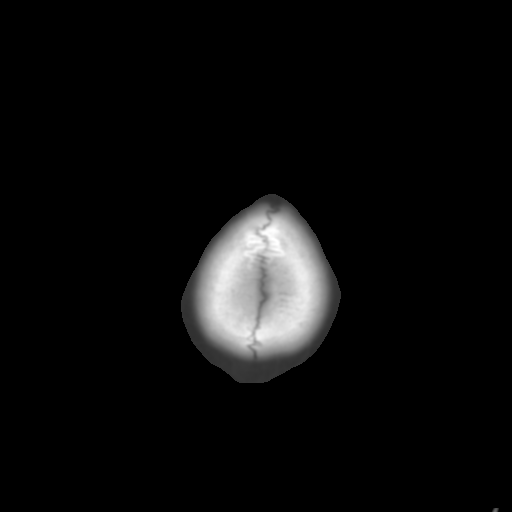

[13 of 30 positions shown; findings below may reference images not displayed]

FINDINGS: There is no evidence for acute hemorrhage, hydrocephalus,
mass lesion, or abnormal extra-axial fluid collection.  No definite
CT evidence for acute infarction.  Imaging after IV contrast
administration shows no abnormal parenchymal enhancement.  No
enhancing mass lesion.

The visualized paranasal sinuses and mastoid air cells are clear.
IMPRESSION: No acute intracranial abnormality.

## 2014-03-22 ENCOUNTER — Emergency Department (HOSPITAL_COMMUNITY): Payer: Medicaid Other

## 2014-03-22 ENCOUNTER — Encounter (HOSPITAL_COMMUNITY): Payer: Self-pay | Admitting: Emergency Medicine

## 2014-03-22 ENCOUNTER — Emergency Department (HOSPITAL_COMMUNITY)
Admission: EM | Admit: 2014-03-22 | Discharge: 2014-03-22 | Disposition: A | Discharge status: Home / Self Care | Payer: Medicaid Other | Attending: Emergency Medicine | Admitting: Emergency Medicine

## 2014-03-22 DIAGNOSIS — Z880 Allergy status to penicillin: Secondary | ICD-10-CM | POA: Diagnosis not present

## 2014-03-22 DIAGNOSIS — Z7952 Long term (current) use of systemic steroids: Secondary | ICD-10-CM | POA: Insufficient documentation

## 2014-03-22 DIAGNOSIS — R109 Unspecified abdominal pain: Secondary | ICD-10-CM | POA: Diagnosis present

## 2014-03-22 DIAGNOSIS — R079 Chest pain, unspecified: Secondary | ICD-10-CM | POA: Diagnosis not present

## 2014-03-22 DIAGNOSIS — Z859 Personal history of malignant neoplasm, unspecified: Secondary | ICD-10-CM | POA: Insufficient documentation

## 2014-03-22 DIAGNOSIS — Z720 Tobacco use: Secondary | ICD-10-CM | POA: Diagnosis not present

## 2014-03-22 DIAGNOSIS — Z79899 Other long term (current) drug therapy: Secondary | ICD-10-CM | POA: Diagnosis not present

## 2014-03-22 DIAGNOSIS — K7031 Alcoholic cirrhosis of liver with ascites: Secondary | ICD-10-CM | POA: Diagnosis not present

## 2014-03-22 HISTORY — DX: Unspecified cirrhosis of liver: K74.60

## 2014-03-22 LAB — CBC WITH DIFFERENTIAL/PLATELET
BASOS PCT: 1 % (ref 0–1)
Basophils Absolute: 0 10*3/uL (ref 0.0–0.1)
Eosinophils Absolute: 0.1 10*3/uL (ref 0.0–0.7)
Eosinophils Relative: 2 % (ref 0–5)
HEMATOCRIT: 42.3 % (ref 39.0–52.0)
Hemoglobin: 14.8 g/dL (ref 13.0–17.0)
LYMPHS ABS: 1 10*3/uL (ref 0.7–4.0)
LYMPHS PCT: 19 % (ref 12–46)
MCH: 36.9 pg — ABNORMAL HIGH (ref 26.0–34.0)
MCHC: 35 g/dL (ref 30.0–36.0)
MCV: 105.5 fL — AB (ref 78.0–100.0)
Monocytes Absolute: 1.2 10*3/uL — ABNORMAL HIGH (ref 0.1–1.0)
Monocytes Relative: 21 % — ABNORMAL HIGH (ref 3–12)
NEUTROS ABS: 3.1 10*3/uL (ref 1.7–7.7)
Neutrophils Relative %: 58 % (ref 43–77)
Platelets: 108 10*3/uL — ABNORMAL LOW (ref 150–400)
RBC: 4.01 MIL/uL — AB (ref 4.22–5.81)
RDW: 12.5 % (ref 11.5–15.5)
WBC: 5.4 10*3/uL (ref 4.0–10.5)

## 2014-03-22 LAB — COMPREHENSIVE METABOLIC PANEL
ALBUMIN: 2.8 g/dL — AB (ref 3.5–5.2)
ALT: 28 U/L (ref 0–53)
AST: 85 U/L — ABNORMAL HIGH (ref 0–37)
Alkaline Phosphatase: 121 U/L — ABNORMAL HIGH (ref 39–117)
Anion gap: 5 (ref 5–15)
BUN: <5 mg/dL — ABNORMAL LOW (ref 6–23)
CHLORIDE: 103 mmol/L (ref 96–112)
CO2: 28 mmol/L (ref 19–32)
CREATININE: 0.52 mg/dL (ref 0.50–1.35)
Calcium: 8.5 mg/dL (ref 8.4–10.5)
GFR calc Af Amer: >90 mL/min (ref >90)
GFR calc non Af Amer: >90 mL/min (ref >90)
Glucose, Bld: 112 mg/dL — ABNORMAL HIGH (ref 70–99)
Potassium: 3.6 mmol/L (ref 3.5–5.1)
SODIUM: 136 mmol/L (ref 135–145)
Total Bilirubin: 2.3 mg/dL — ABNORMAL HIGH (ref 0.3–1.2)
Total Protein: 7.4 g/dL (ref 6.0–8.3)

## 2014-03-22 LAB — URINALYSIS, ROUTINE W REFLEX MICROSCOPIC
Bilirubin Urine: NEGATIVE
Glucose, UA: NEGATIVE mg/dL
HGB URINE DIPSTICK: NEGATIVE
Ketones, ur: NEGATIVE mg/dL
Leukocytes, UA: NEGATIVE
Nitrite: NEGATIVE
PROTEIN: NEGATIVE mg/dL
SPECIFIC GRAVITY, URINE: 1.006 (ref 1.005–1.030)
UROBILINOGEN UA: 1 mg/dL (ref 0.0–1.0)
pH: 6.5 (ref 5.0–8.0)

## 2014-03-22 LAB — PROTIME-INR
INR: 1.35 (ref 0.00–1.49)
Prothrombin Time: 16.8 seconds — ABNORMAL HIGH (ref 11.6–15.2)

## 2014-03-22 LAB — ETHANOL: ALCOHOL ETHYL (B): 194 mg/dL — AB (ref 0–9)

## 2014-03-22 LAB — I-STAT TROPONIN, ED: Troponin i, poc: 0 ng/mL (ref 0.00–0.08)

## 2014-03-22 LAB — APTT: aPTT: 38 seconds — ABNORMAL HIGH (ref 24–37)

## 2014-03-22 MED ORDER — LORAZEPAM 2 MG/ML IJ SOLN
1.0000 mg | Freq: Once | INTRAMUSCULAR | Status: AC
  Administered 2014-03-22: 1 mg via INTRAVENOUS
  Filled 2014-03-22: qty 1

## 2014-03-22 MED ORDER — THIAMINE HCL 100 MG/ML IJ SOLN
100.0000 mg | Freq: Once | INTRAMUSCULAR | Status: AC
  Administered 2014-03-22: 100 mg via INTRAVENOUS
  Filled 2014-03-22: qty 2

## 2014-03-22 MED ORDER — ALBUMIN HUMAN 25 % IV SOLN
25.0000 g | Freq: Once | INTRAVENOUS | Status: AC
  Administered 2014-03-22: 25 g via INTRAVENOUS
  Filled 2014-03-22: qty 100

## 2014-03-22 NOTE — ED Provider Notes (Signed)
TIME SEEN: 12:20 PM  CHIEF COMPLAINT: Multiple complaints  HPI: Pt is a 54 y.o. male with history of hypertension, alcohol abuse with cirrhosis who presents to the emergency department with complaints of abdominal distention over the past several weeks. States that he is having abdominal pain because of tightening of the skin. He also reports several weeks of intermittent substernal chest heaviness with radiation into his left arm, shortness of breath. States the pain is not exertional or pleuritic. Reports it is present when he lies flat. He is also had nausea. No diaphoresis or dizziness. Has noticed easy bruising but no easy bleeding. Has had intermittent hematochezia, none currently. No melena. No fevers, vomiting or diarrhea. Reports his last paracentesis was over a year ago at [facility]. Patient reports his last drink was just prior to arrival.  Patient's primary care physician is Dr. Helene Kelp in [facility] Patient's liver specialist is Dr. Melina Copa in [place], states he last saw him 6 months ago  ROS: See HPI Constitutional: no fever  Eyes: no drainage  ENT: no runny nose   Cardiovascular:   chest pain  Resp:  SOB  GI: no vomiting GU: no dysuria Integumentary: no rash  Allergy: no hives  Musculoskeletal: no leg swelling  Neurological: no slurred speech ROS otherwise negative  PAST MEDICAL HISTORY/PAST SURGICAL HISTORY:  Past Medical History  Diagnosis Date  . Alcohol abuse   . Cancer   . Cirrhosis     MEDICATIONS:  Prior to Admission medications   Medication Sig Start Date End Date Taking? Authorizing Provider  furosemide (LASIX) 20 MG tablet Take 20 mg by mouth daily.   Yes Historical Provider, MD  nadolol (CORGARD) 40 MG tablet Take 40 mg by mouth daily.   Yes Historical Provider, MD  rifaximin (XIFAXAN) 550 MG TABS tablet Take 550 mg by mouth 2 (two) times daily.   Yes Historical Provider, MD  spironolactone (ALDACTONE) 100 MG tablet Take 100 mg by mouth 2  (two) times daily.   Yes Historical Provider, MD  traMADol (ULTRAM) 50 MG tablet Take 50-100 mg by mouth every 6 (six) hours as needed.   Yes Historical Provider, MD  citalopram (CELEXA) 20 MG tablet Take 20 mg by mouth daily.     Historical Provider, MD  lisinopril (PRINIVIL,ZESTRIL) 10 MG tablet Take 10 mg by mouth daily.    Historical Provider, MD  risperiDONE (RISPERDAL) 3 MG tablet Take 3 mg by mouth at bedtime.      Historical Provider, MD    ALLERGIES:  Allergies  Allergen Reactions  . Penicillins     unknown    SOCIAL HISTORY:  History  Substance Use Topics  . Smoking status: Current Every Day Smoker  . Smokeless tobacco: Not on file  . Alcohol Use: Yes     Comment: Relapsed yesterday, drank 7  beers by report    FAMILY HISTORY: No family history on file.  EXAM: BP 126/65 mmHg  Pulse 69  Temp(Src) 98.3 F (36.8 C) (Oral)  Resp 16  SpO2 99% CONSTITUTIONAL: Alert and oriented and responds appropriately to questions. Well-appearing; well-nourished, laughing, joking HEAD: Normocephalic EYES: Conjunctivae clear, PERRL ENT: normal nose; no rhinorrhea; moist mucous membranes; pharynx without lesions noted NECK: Supple, no meningismus, no LAD  CARD: RRR; S1 and S2 appreciated; no murmurs, no clicks, no rubs, no gallops RESP: Normal chest excursion without splinting or tachypnea; breath sounds clear and equal bilaterally; no wheezes, no rhonchi, no rales, no hypoxia or respiratory distress ABD/GI: Normal  bowel sounds; abdomen significantly distended with fluid wave, no tympany, soft, nontender, no guarding or rebound, no peritoneal signs BACK:  The back appears normal and is non-tender to palpation, there is no CVA tenderness EXT: Normal ROM in all joints; non-tender to palpation; no edema; normal capillary refill; no cyanosis    SKIN: Normal color for age and race; warm, multiple areas of bruising to the skin NEURO: Moves all extremities equally, sensation to light touch  intact diffusely PSYCH: The patient's mood and manner are appropriate. Grooming and personal hygiene are appropriate.  MEDICAL DECISION MAKING: Patient here with ascites from his known cirrhosis and continued alcohol abuse. He has no tenderness on exam and no fever to suggest SBP. I do not feel he needs an emergent tap. He is not in respiratory distress and is smiling, laughing. Have advised him to stop drinking. Will check liver function tests, coags. He is complaining of intermittent episodes of chest pain and shortness of breath that been going on for weeks. He does have history of hypertension but denies diabetes or hyperlipidemia. No family history. He is a smoker. Heart score is 2 - 3.  We'll obtain cardiac labs, chest x-ray and EKG.  ED PROGRESS: Patient's labs show mild elevation of his AST, alkaline phosphatase. Albumin is low at 2.8. Troponin is negative. Alcohol is elevated at 194. Coags normal. I do not feel patient needs admission to the hospital. He has been complaining of this chest pain and shortness of breath for over 3 weeks now and has had negative EKG and troponin today. Chest pain is atypical and nonexertional. Has been constant since early this morning. At this point I do not feel he needs serial enzymes. I have advised him to follow up with his primary care physician. I do not feel patient needs admission to the hospital. It appears his primary reason for coming to the emergency department is for paracentesis. Have discussed this with Dr. Lewie Chamber PA with interventional radiology as well as Carlyon Shadow who is an ultrasound technician. She states they can perform patient's paracentesis today at 3:30 PM. His albumin level is low at 2.8. Will give him 25 g of albumin in the ED. Updated patient and family.     2:45 PM  Patient feels agitated and reports he is ready to leave. Have offered him Ativan to help with his withdrawal symptoms. He agrees to stay and wait for paracentesis.  4:05  PM  Pt should be going for [place] for paracentesis soon.  Resting comfortably in the ED. Plan is to discharge patient home after paracentesis. We'll give gastroenterology follow-up information, [place] and wellness follow-up information. Have advised him to quit drinking but he states that he does not one to at this time. Advised to follow-up with her primary care physician for his continued episodes of chest pain for the past several weeks but I do not feel he needs to be admitted to the hospital as he has had a negative troponin with several hours of constant pain. I feel his pain and shortness of breath may be secondary to his ascites as he feels it is worse with lying flat and feels like his belly is moving towards his chest.      EKG Interpretation  Date/Time:  Tuesday March 22 2014 12:51:17 EST Ventricular Rate:  80 PR Interval:  153 QRS Duration: 108 QT Interval:  415 QTC Calculation: 479 R Axis:   67 Text Interpretation:  Sinus rhythm Borderline prolonged QT interval  Baseline wander in lead(s) V3 V4 Confirmed by Arlynn Stare,  DO, Ardys Hataway [phone]) on 03/22/2014 12:54:56 PM        [place], DO 03/22/14 1607

## 2014-03-22 NOTE — ED Notes (Signed)
Pt c/o abd pain and distention that has got worse over the past several weeks.  Pt states that he has cirrhosis of liver and has had to be drained before.

## 2014-03-22 NOTE — ED Notes (Signed)
Pt presents to ED C/O chest pain that has been present for the past several weeks. Pt states that chest pain is on left side of chest. Pt also complains of SOB that has been present for several weeks. Pt states that chest pain is "lightening like" and is constant along with chest pressure. Pt also presents with abdominal distention along with abdominal tenderness upon deep palpation of RLQ. Patient currently voiding at this time to collect for urinalysis. Pt in NAD. Will continue to monitor.

## 2014-03-22 NOTE — ED Provider Notes (Signed)
Pt here with alcoholic cirrhosis and ascites. Initial plan to perform therapeutic paracentesis, but Radiology is currently unable to consent for procedure given patient's EtOH level.  Pt does not want to stay in department at this time- plan to d/c home with referral to outpatient resources.  Quintella Reichert, MD 03/22/14 1723

## 2014-03-22 NOTE — Discharge Instructions (Signed)
Ascites °Ascites is a gathering of fluid in the belly (abdomen). This is most often caused by liver disease. It may also be caused by a number of other less common problems. It causes a ballooning out (distension) of the abdomen. °CAUSES  °Scarring of the liver (cirrhosis) is the most common cause of ascites. Other causes include: °· Infection or inflammation in the abdomen. °· Cancer in the abdomen. °· Heart failure. °· Certain forms of kidney failure (nephritic syndrome). °· Inflammation of the pancreas. °· Clots in the veins of the liver. °SYMPTOMS  °In the early stages of ascites, you may not have any symptoms. The main symptom of ascites is a sense of abdominal bloating. This is due to the presence of fluid. This may also cause an increase in abdominal or waist size. People with this condition can develop swelling in the legs, and men can develop a swollen scrotum. When there is a lot of fluid, it may be hard to breath. Stretching of the abdomen by fluid can be painful. °DIAGNOSIS  °Certain features of your medical history, such as a history of liver disease and of an enlarging abdomen, can suggest the presence of ascites. The diagnosis of ascites can be made on physical exam by your caregiver. An abdominal ultrasound examination can confirm that ascites is present, and estimate the amount of fluid. °Once ascites is confirmed, it is important to determine its cause. Again, a history of one of the conditions listed in "CAUSES" provides a strong clue. A physical exam is important, and blood and X-ray tests may be needed. During a procedure called paracentesis, a sample of fluid is removed from the abdomen. This can determine certain key features about the fluid, such as whether or not infection or cancer is present. Your caregiver will determine if a paracentesis is necessary. They will describe the procedure to you. °PREVENTION  °Ascites is a complication of other conditions. Therefore to prevent ascites, you  must seek treatment for any significant health conditions you have. Once ascites is present, careful attention to fluid and salt intake may help prevent it from getting worse. If you have ascites, you should not drink alcohol. °PROGNOSIS  °The prognosis of ascites depends on the underlying disease. If the disease is reversible, such as with certain infections or with heart failure, then ascites may improve or disappear. When ascites is caused by cirrhosis, then it indicates that the liver disease has worsened, and further evaluation and treatment of the liver disease is needed. If your ascites is caused by cancer, then the success or failure of the cancer treatment will determine whether your ascites will improve or worsen. °RISKS AND COMPLICATIONS  °Ascites is likely to worsen if it is not properly diagnosed and treated. A large amount of ascites can cause pain and difficulty breathing. The main complication, besides worsening, is infection (called spontaneous bacterial peritonitis). This requires prompt treatment. °TREATMENT  °The treatment of ascites depends on its cause. When liver disease is your cause, medical management using water pills (diuretics) and decreasing salt intake is often effective. Ascites due to peritoneal inflammation or malignancy (cancer) alone does not respond to salt restriction and diuretics. Hospitalization is sometimes required. °If the treatment of ascites cannot be managed with medications, a number of other treatments are available. Your caregivers will help you decide which will work best for you. Some of these are: °· Removal of fluid from the abdomen (paracentesis). °· Fluid from the abdomen is passed into a vein (peritoneovenous shunting). °·   Liver transplantation.  Transjugular intrahepatic portosystemic stent shunt. HOME CARE INSTRUCTIONS  It is important to monitor body weight and the intake and output of fluids. Weigh yourself at the same time every day. Record your  weights. Fluid restriction may be necessary. It is also important to know your salt intake. The more salt you take in, the more fluid you will retain. Ninety percent of people with ascites respond to this approach.  Follow any directions for medicines carefully.  Follow up with your caregiver, as directed.  Report any changes in your health, especially any new or worsening symptoms.  If your ascites is from liver disease, avoid alcohol and other substances toxic to the liver. SEEK MEDICAL CARE IF:   Your weight increases more than a few pounds in a few days.  Your abdominal or waist size increases.  You develop swelling in your legs.  You had swelling and it worsens. SEEK IMMEDIATE MEDICAL CARE IF:   You develop a fever.  You develop new abdominal pain.  You develop difficulty breathing.  You develop confusion.  You have bleeding from the mouth, stomach, or rectum. MAKE SURE YOU:   Understand these instructions.  Will watch your condition.  Will get help right away if you are not doing well or get worse. Document Released: 01/07/2005 Document Revised: 04/01/2011 Document Reviewed: 08/08/2006 [facility] Patient Information 2015 [place], [place]. This information is not intended to replace advice given to you by your health care provider. Make sure you discuss any questions you have with your health care provider.  Alcoholic Liver Disease Alcoholic liver disease means you have a damaged liver that does not work properly. This disease is caused by drinking too much alcohol. Some people do not have any problems until the disease has become very bad. Problems can be worse after a period of heavy drinking. HOME CARE  Stop drinking alcohol.  Get expert advice or help (counseling) to quit drinking. Join an alchohol support group.  You may need to eat foods that give you energy (carbohydrates), such as:  Milk and yogurt.  Navy, pinto, and white beans.  Applesauce, grapes,  dried dates, prunes, and raisins.  Potatoes and rice.  Eat foods that are high in vitamins, especially thiamine and folic acid. These foods include:  Whole-wheat or whole-grain breads and cereals. Look on the package for "added thiamine" or "added folic acid."  Meat, especially pork.  Fresh, raw vegetables.  Fresh fruits and vegetables, such as oranges, orange juice, avocados, beets, and cantaloupe.  Dark green, leafy vegetables, such as romaine lettuce, spinach, and broccoli.  Beans and nuts.  Dairy foods, such as milk, butter, yogurt, cheese, and ice cream. GET HELP RIGHT AWAY IF:   You have bright red blood in your poop (stool).  You cough or throw up (vomit) blood.  Your skin and eyes turn more yellow.  You have a bad headache or problems thinking.  You have trouble walking. MAKE SURE YOU:   Understand these instructions.  Will watch your condition.  Will get help right away if you are not doing well or get worse. Document Released: 11/04/2008 Document Revised: 04/01/2011 Document Reviewed: 11/04/2008 [facility] Patient Information 2015 [place], [place]. This information is not intended to replace advice given to you by your health care provider. Make sure you discuss any questions you have with your health care provider.   Alcohol Use Disorder Alcohol use disorder is a mental disorder. It is not a one-time incident of heavy drinking. Alcohol use  disorder is the excessive and uncontrollable use of alcohol over time that leads to problems with functioning in one or more areas of daily living. People with this disorder risk harming themselves and others when they drink to excess. Alcohol use disorder also can cause other mental disorders, such as mood and anxiety disorders, and serious physical problems. People with alcohol use disorder often misuse other drugs.  Alcohol use disorder is common and widespread. Some people with this disorder drink alcohol to cope with or  escape from negative life events. Others drink to relieve chronic pain or symptoms of mental illness. People with a family history of alcohol use disorder are at higher risk of losing control and using alcohol to excess.  SYMPTOMS  Signs and symptoms of alcohol use disorder may include the following:   Consumption ofalcohol inlarger amounts or over a longer period of time than intended.  Multiple unsuccessful attempts to cutdown or control alcohol use.   A great deal of time spent obtaining alcohol, using alcohol, or recovering from the effects of alcohol (hangover).  A strong desire or urge to use alcohol (cravings).   Continued use of alcohol despite problems at work, school, or home because of alcohol use.   Continued use of alcohol despite problems in relationships because of alcohol use.  Continued use of alcohol in situations when it is physically hazardous, such as driving a car.  Continued use of alcohol despite awareness of a physical or psychological problem that is likely related to alcohol use. Physical problems related to alcohol use can involve the brain, heart, liver, stomach, and intestines. Psychological problems related to alcohol use include intoxication, depression, anxiety, psychosis, delirium, and dementia.   The need for increased amounts of alcohol to achieve the same desired effect, or a decreased effect from the consumption of the same amount of alcohol (tolerance).  Withdrawal symptoms upon reducing or stopping alcohol use, or alcohol use to reduce or avoid withdrawal symptoms. Withdrawal symptoms include:  Racing heart.  Hand tremor.  Difficulty sleeping.  Nausea.  Vomiting.  Hallucinations.  Restlessness.  Seizures. DIAGNOSIS Alcohol use disorder is diagnosed through an assessment by your health care provider. Your health care provider may start by asking three or four questions to screen for excessive or problematic alcohol use. To confirm  a diagnosis of alcohol use disorder, at least two symptoms must be present within a 28-month period. The severity of alcohol use disorder depends on the number of symptoms:  Mild--two or three.  Moderate--four or five.  Severe--six or more. Your health care provider may perform a physical exam or use results from lab tests to see if you have physical problems resulting from alcohol use. Your health care provider may refer you to a mental health professional for evaluation. TREATMENT  Some people with alcohol use disorder are able to reduce their alcohol use to low-risk levels. Some people with alcohol use disorder need to quit drinking alcohol. When necessary, mental health professionals with specialized training in substance use treatment can help. Your health care provider can help you decide how severe your alcohol use disorder is and what type of treatment you need. The following forms of treatment are available:   Detoxification. Detoxification involves the use of prescription medicines to prevent alcohol withdrawal symptoms in the first week after quitting. This is important for people with a history of symptoms of withdrawal and for heavy drinkers who are likely to have withdrawal symptoms. Alcohol withdrawal can be dangerous and, in  severe cases, cause death. Detoxification is usually provided in a hospital or in-patient substance use treatment facility.  Counseling or talk therapy. Talk therapy is provided by substance use treatment counselors. It addresses the reasons people use alcohol and ways to keep them from drinking again. The goals of talk therapy are to help people with alcohol use disorder find healthy activities and ways to cope with life stress, to identify and avoid triggers for alcohol use, and to handle cravings, which can cause relapse.  Medicines.Different medicines can help treat alcohol use disorder through the following actions:  Decrease alcohol cravings.  Decrease  the positive reward response felt from alcohol use.  Produce an uncomfortable physical reaction when alcohol is used (aversion therapy).  Support groups. Support groups are run by people who have quit drinking. They provide emotional support, advice, and guidance. These forms of treatment are often combined. Some people with alcohol use disorder benefit from intensive combination treatment provided by specialized substance use treatment centers. Both inpatient and outpatient treatment programs are available. Document Released: 02/15/2004 Document Revised: 05/24/2013 Document Reviewed: 04/16/2012 [facility] Patient Information 2015 [place], [place]. This information is not intended to replace advice given to you by your health care provider. Make sure you discuss any questions you have with your health care provider.     Emergency Department Resource Guide 1) Find a Doctor and Pay Out of Pocket Although you won't have to find out who is covered by your insurance plan, it is a good idea to ask around and get recommendations. You will then need to call the office and see if the doctor you have chosen will accept you as a new patient and what types of options they offer for patients who are self-pay. Some doctors offer discounts or will set up payment plans for their patients who do not have insurance, but you will need to ask so you aren't surprised when you get to your appointment.  2) Contact Your Local Health Department Not all health departments have doctors that can see patients for sick visits, but many do, so it is worth a call to see if yours does. If you don't know where your local health department is, you can check in your phone book. The CDC also has a tool to help you locate your state's health department, and many state websites also have listings of all of their local health departments.  3) Find a [facility] If your illness is not likely to be very severe or complicated, you may  want to try a walk in clinic. These are popping up all over the country in pharmacies, drugstores, and shopping centers. They're usually staffed by nurse practitioners or physician assistants that have been trained to treat common illnesses and complaints. They're usually fairly quick and inexpensive. However, if you have serious medical issues or chronic medical problems, these are probably not your best option.  No Primary Care Doctor: - Call Health Connect at  [phone] - they can help you locate a primary care doctor that  accepts your insurance, provides certain services, etc. - Physician Referral Service- [phone]  Chronic Pain Problems: Organization         Address  Phone   Notes  [facility]  [phone] Patients need to be referred by their primary care doctor.   Medication Assistance: Organization         Address  Phone   Notes  [facility] Medication Assistance Program [address]  [place], [place] [address] [phone] --Must be a resident of [facility] -- Must have NO insurance coverage whatsoever (no Medicaid/ Medicare, etc.) -- The pt. MUST have a primary care doctor that directs their care regularly and follows them in the community   MedAssist  [phone]   [employer]  [phone]    Agencies that provide inexpensive medical care: Organization         Address  Phone   Notes  [place]  [phone]   [facility]    [phone]   [facility] [address], [place] [address] [phone]   [place] [address], [place] [phone]   Planned Parenthood    [phone]   [facility]    [phone]   [place] and [place] [address], [place] Phone:  [phone], Fax:  [phone] Hours of Operation:  9 am - 6 pm, M-F.  Also accepts Medicaid/Medicare and self-pay.   [facility] for [place] [address], [address], [place] Phone: [phone], Fax: [phone]. Hours of Operation:  8:30 am - 5:30 pm, M-F.  Also accepts Medicaid and self-pay.  [facility] [address] [place] Phone: [phone]   [place], [place], [place] [phone], Ext. [phone] Mondays & Thursdays: 7-9 AM.  First 15 patients are seen on a first come, first serve basis.    [place] Providers:  Organization         Address  Phone   Notes  [facility] [address], [place] [phone] Also accepts self-pay patients.  [facility] [address], [place]  [phone]   [place], [address], [place] [phone]   [facility] [address] [place] [phone]   Lucianne Lei [address], [place]   [phone] Only accepts [place] Access [place] patients after they have their name applied to their card.   Self-Pay (no insurance) in [facility]:  Organization         Address  Phone   Notes  Sickle Cell Patients, [facility] [employer] [phone]   [facility] [place] [phone]   [facility] [place]  [place], [place], [place] [phone]   [facility]/Dr. Osei-Bonsu  [address], [place] or [address], [place] [phone] Phone number for both [place] and [place] locations is the same.  [facility] [address] [place] [phone]   [facility] [address], [place] or [address] [phone]   [facility] [address] [place] [phone], phone; [phone], fax Sees patients 1st and 3rd Saturday of every month.  Must not qualify for public or private insurance (i.e. Medicaid, Medicare, [place] Health Choice, Veterans' Benefits)  Household income should be no more than 200% of the poverty level The clinic cannot treat you if you are pregnant or think you are pregnant  Sexually transmitted diseases are not treated at the clinic.    Dental  Care: Organization         Address  Phone  Notes  [facility] Department of [facility] [place] [phone] Accepts children up to age 103 who are enrolled in [place] or [place]; pregnant women with a Medicaid card; and children who have applied for Medicaid or [place] Health Choice, but were declined, whose parents can pay a reduced fee at time of service.  [facility] Department of [facility]  [address], [place] [phone] Accepts children up to age 4 who are enrolled in [place] or [place]; pregnant women with a Medicaid card; and children who have applied for Medicaid or  Health Choice, but were declined, whose parents can pay a reduced fee at time of service.  [place] Adult [facility] Access PROGRAM  [place] [phone] Patients are seen by appointment only. Walk-ins are not accepted. [place] will see patients 68 years of age and older. Monday - Tuesday (8am-5pm) Most Wednesdays (8:30-5pm) $30 per visit, cash only  [facility] Adult Dental Access PROGRAM  [address], [facility] [phone] Patients are seen by appointment only. Walk-ins are not accepted. [place] will see patients 24 years of age and older. One Wednesday Evening (Monthly: Volunteer Based).  $30 per visit, cash only  [place]  [phone] for adults; Children under age 42, call Graduate Pediatric Dentistry at [phone]. Children aged 46-14, please call [phone] to request a pediatric application.  Dental services are provided in all areas of dental care including fillings, crowns and bridges, complete and partial dentures, implants, gum treatment, root canals, and extractions. Preventive care is also provided. Treatment is provided to both adults and children. Patients are selected via a lottery and there is often a waiting list.   [facility] [address] [place]  [phone] [email or web address]   [facility] [address] [place], [place] [phone], Ext. [phone] Second and Fourth Thursday of each month, opens at 6:30 AM; Clinic ends at 9 AM.  Patients are seen on a first-come first-served basis, and a limited number are seen during each clinic.   [facility]  [address] [place], [place] [phone]   Eligibility Requirements You must have lived in [place], [place], or [place] counties for at least the last three months.   You cannot be eligible for state or federal sponsored [employer], including [employer], [place], or [employer].   You generally cannot be eligible for healthcare insurance through your employer.    How to apply: Eligibility screenings are held every Tuesday and Wednesday afternoon from 1:00 pm until 4:00 pm. You do not need an appointment for the interview!  [facility] [address] [place], [place]   [place]  [place] Department  [place]  [phone]    Behavioral Health Resources in the Community: Intensive Outpatient Programs Organization         Address  Phone  Notes  [place]. [address] [place], [place] [phone]   [facility] [address], [place], [place]   ADS: [facility] [address], [place], [place]    [place] [address],  [place], [place] or [phone]   Substance Abuse Resources Organization         Address  Phone  Notes  Alcohol and Drug Services  [place]  [phone]   [facility] [place]  [phone]   [place]a [place]  [phone]   Residential & Outpatient Substance Abuse Program  [phone]   Psychological Services Organization         Address  Phone  Notes  [facility] [place]  [place]  [phone]   [place] [address], [place] or [phone]    Mobile Crisis Teams Organization         Address  Phone  Notes  [facility], Mobile Crisis Care Unit  [phone]   [facility]  [address] [place], [place]   [place] [address], [place] [phone]    Self-Help/Support Groups Organization         Address  Phone             Notes  [place] of [place] - variety of support groups  [place] Call for more information  Narcotics Anonymous (NA), [facility] [address], [employer] [place]  2 meetings at this location   Special educational needs teacher         Address  Phone  Notes  [facility]idential [facility] [place],    [place]  [phone]   [facility]  [address], [place] [phone], [place], [place]   [place], [place] [phone] Admissions: 8am-3pm M-F  Incentives Substance [place] [address]    [place], [place] [phone]   [facility] [address] [place], [place], [place]   [facility] [address]  [place], [place]   [facility] - Intensive Outpatient [address] Kristeen [address], [place], [place]   [facility] ([place] [address]  [place], [place] [phone] or [phone]   [facility] ([facility]) [address], [place], [place] Accepts Medicaid  [facility] [place] [address]  [place] [phone] Substance Abuse/Addiction Treatment   [facility] Organization         Address  Phone  Notes  [facility]  [phone]   Domenic Schwab, PhD [address] Arlis Porta [place], [place]   [phone] or [phone]   [place], [place] [phone]   [facility] [address] [place], [place] [phone] Insurance/Medicaid/sponsorship through [facility] and Families [address]                                    [place], [place] [phone] [facility] [address], [place] [phone]    Dr. Adele Schilder  [phone]   [facility] Dept. 1) [address] [place] 2) [place] 3)  [place] [address], [place] [phone]  [phone]   [place] [phone] or [phone] (After Hours)       Paracentesis Paracentesis is a  procedure used to remove excess fluid from the belly (abdomen). Excess fluid in the belly is called ascites. Excess fluid can be the result of certain conditions, such as infection, inflammation, abdominal injury, heart failure, chronic scarring of the liver (cirrhosis), or cancer. The excess fluid is removed using a needle inserted through the skin and tissue into the abdomen.  A paracentesis may be done to:  Determine the cause of the excess fluid through examination of the fluid.  Relieve symptoms of shortness of breath or pain caused by the excess fluid.  Determine presence of bleeding after an abdominal injury. LET YOUR CAREGIVERS KNOW  ABOUT:  Allergies.  Medications taken including herbs, eye drops, over-the-counter medications, and creams.  Use of steroids (by mouth or creams).  Previous problems with anesthetics or numbing medicine.  Possibility of pregnancy, if this applies.  History of blood clots (thrombophlebitis).  History of bleeding or blood problems.  Previous surgery.  Other health problems. RISKS AND COMPLICATIONS  Injury to an abdominal organ, such as the bowel (large intestine), liver, spleen, or bladder.  Possible infection.  Bleeding.  Low blood pressure (hypotension). BEFORE THE PROCEDURE This is a procedure that can be done as an outpatient. Confirm the time that you need to arrive for your procedure. A blood sample may be done to determine your blood clotting time. The presence of a severe bleeding disorder (coagulopathy) which cannot be promptly corrected may make this procedure inadvisable. You may be asked to urinate. PROCEDURE The procedure will take about 30 minutes. This time will vary depending on the amount of fluid that is removed. You may be asked to lie on your back with your head elevated. An area on your abdomen will be cleansed. A numbing medicine may then be injected (local anesthesia) into the skin and tissue. A needle is inserted through your abdominal skin and tissues until it is positioned in your abdomen. You may feel pressure or slight pain as the needle is positioned into the abdomen. Fluid is removed from the abdomen through the needle. Tell your caregiver if you feel dizzy or lightheaded. The needle is withdrawn once the desired amount of fluid has been removed. A sample of the fluid may be sent for examination.  AFTER THE PROCEDURE Your recovery will be assessed and monitored. If there are no problems, as an outpatient, you should be able to go home shortly after the procedure. There may be a very limited amount of clear fluid draining from the needle insertion site over  the next 2 days. Confirm with your caregiver as to the expected amount of drainage. Obtaining the Test Results It is your responsibility to obtain your test results. Do not assume everything is normal if you have not heard from your caregiver or the medical facility. It is important for you to follow up on all of your test results. HOME CARE INSTRUCTIONS   You may resume normal diet and activities as directed or allowed.  Only take over-the-counter or prescription medicines for pain, discomfort, or fever as directed by your caregiver. SEEK IMMEDIATE MEDICAL CARE IF:  You develop shortness of breath or chest pain.  You develop increasing pain, discomfort, or swelling in your abdomen.  You develop new drainage or pus coming from site where fluid was removed.  You develop swelling or increased redness from site where fluid was removed.  You develop an unexplained temperature of 102 F (38.9 C) or above. Document Released: 07/23/2004 Document Revised: 04/01/2011 Document Reviewed: 08/29/2008 [employer]  Patient Information 2015 [place]. This information is not intended to replace advice given to you by your health care provider. Make sure you discuss any questions you have with your health care provider.

## 2014-03-22 NOTE — ED Notes (Signed)
Lab delay pt not in room.

## 2014-03-22 NOTE — ED Notes (Signed)
Pt ambulated to restroom at this time.

## 2014-03-22 NOTE — ED Notes (Signed)
Pt [name] DO NOT GIVE remainder albumin since unable to perform US paracentesis.

## 2014-03-22 NOTE — ED Notes (Signed)
Pt to US paracentesis. Pt has had 6.25 mg of albumin at present time.

## 2014-03-22 NOTE — ED Notes (Signed)
Pt currently sitting on side of bed. Pt seems agitated, Denies any further needs at this time. Will continue to monitor.

## 2014-03-22 NOTE — ED Notes (Signed)
Pt reports to speak with MD; Ward MD made aware.

## 2014-03-22 NOTE — ED Notes (Signed)
Ward MD at bedside. 

## 2014-05-02 ENCOUNTER — Other Ambulatory Visit (HOSPITAL_COMMUNITY): Payer: Self-pay | Admitting: Physician Assistant

## 2014-05-02 DIAGNOSIS — R188 Other ascites: Secondary | ICD-10-CM

## 2014-05-10 ENCOUNTER — Ambulatory Visit (HOSPITAL_COMMUNITY)
Admission: RE | Admit: 2014-05-10 | Discharge: 2014-05-10 | Disposition: A | Discharge status: Home / Self Care | Payer: Medicaid Other | Source: Ambulatory Visit | Attending: Physician Assistant | Admitting: Physician Assistant

## 2014-05-10 ENCOUNTER — Encounter (HOSPITAL_COMMUNITY): Payer: Self-pay

## 2014-05-10 ENCOUNTER — Encounter (HOSPITAL_COMMUNITY)
Admission: RE | Admit: 2014-05-10 | Discharge: 2014-05-10 | Disposition: A | Discharge status: Home / Self Care | Payer: Medicaid Other | Source: Ambulatory Visit | Attending: Physician Assistant | Admitting: Physician Assistant

## 2014-05-10 DIAGNOSIS — R188 Other ascites: Secondary | ICD-10-CM | POA: Insufficient documentation

## 2014-05-10 DIAGNOSIS — K7460 Unspecified cirrhosis of liver: Secondary | ICD-10-CM | POA: Diagnosis not present

## 2014-05-10 LAB — BODY FLUID CELL COUNT WITH DIFFERENTIAL
Eos, Fluid: 1 %
Lymphs, Fluid: 54 %
MONOCYTE-MACROPHAGE-SEROUS FLUID: 43 % — AB (ref 50–90)
Neutrophil Count, Fluid: 2 % (ref 0–25)
WBC FLUID: 117 uL (ref 0–1000)

## 2014-05-10 MED ORDER — SODIUM CHLORIDE 0.9 % IV SOLN
Freq: Once | INTRAVENOUS | Status: AC
  Administered 2014-05-10: 250 mL via INTRAVENOUS

## 2014-05-10 MED ORDER — ALBUMIN HUMAN 25 % IV SOLN
25.0000 g | Freq: Once | INTRAVENOUS | Status: AC
  Administered 2014-05-10: 25 g via INTRAVENOUS
  Filled 2014-05-10: qty 100

## 2014-05-10 NOTE — Progress Notes (Signed)
Tolerated albumin infusion well. Discharged instructions reviewed with patient and sister. Verbalized understanding. Home with sister.

## 2014-05-10 NOTE — Discharge Instructions (Signed)
Albumin injection What is this medicine? ALBUMIN (al BYOO min) is used to treat or prevent shock following serious injury, bleeding, surgery, or burns by increasing the volume of blood plasma. This medicine can also replace low blood protein. This medicine may be used for other purposes; ask your health care provider or pharmacist if you have questions. COMMON BRAND NAME(S): Albuked, Albumarc, Albuminar, Albutein, Buminate, Flexbumin, Kedbumin, Macrotec, Plasbumin What should I tell my health care provider before I take this medicine? They need to know if you have any of the following conditions: -anemia -heart disease -kidney disease -an unusual or allergic reaction to albumin, other medicines, foods, dyes, or preservatives -pregnant or trying to get pregnant -breast-feeding How should I use this medicine? This medicine is for infusion into a vein. It is given by a health-care professional in a hospital or clinic. Talk to your pediatrician regarding the use of this medicine in children. While this drug may be prescribed for selected conditions, precautions do apply. Overdosage: If you think you have taken too much of this medicine contact a poison control center or emergency room at once. NOTE: This medicine is only for you. Do not share this medicine with others. What if I miss a dose? This does not apply. What may interact with this medicine? Interactions are not expected. This list may not describe all possible interactions. Give your health care provider a list of all the medicines, herbs, non-prescription drugs, or dietary supplements you use. Also tell them if you smoke, drink alcohol, or use illegal drugs. Some items may interact with your medicine. What should I watch for while using this medicine? Your condition will be closely monitored while you receive this medicine. Some products are derived from human plasma, and there is a small risk that these products may contain certain  types of virus or bacteria. All products are processed to kill most viruses and bacteria. If you have questions concerning the risk of infections, discuss them with your doctor or health care professional. What side effects may I notice from receiving this medicine? Side effects that you should report to your doctor or health care professional as soon as possible: -allergic reactions like skin rash, itching or hives, swelling of the face, lips, or tongue -breathing problems -changes in heartbeat -fever, chills -pain, redness or swelling at the injection site -signs of viral infection including fever, drowsiness, chills, runny nose followed in about 2 weeks by a rash and joint pain -tightness in the chest Side effects that usually do not require medical attention (report to your doctor or health care professional if they continue or are bothersome): -increased salivation -nausea, vomiting This list may not describe all possible side effects. Call your doctor for medical advice about side effects. You may report side effects to FDA at 1-800-FDA-1088. Where should I keep my medicine? This does not apply. You will not be given this medicine to store at home. NOTE: This sheet is a summary. It may not cover all possible information. If you have questions about this medicine, talk to your doctor, pharmacist, or health care provider.  2015, [employer]. (2007-04-02 10:18:55) Paracentesis Paracentesis is a procedure used to remove excess fluid from the belly (abdomen). Excess fluid in the belly is called ascites. Excess fluid can be the result of certain conditions, such as infection, inflammation, abdominal injury, heart failure, chronic scarring of the liver (cirrhosis), or cancer. The excess fluid is removed using a needle inserted through the skin and tissue into the  abdomen.  A paracentesis may be done to:  Determine the cause of the excess fluid through examination of the  fluid.  Relieve symptoms of shortness of breath or pain caused by the excess fluid.  Determine presence of bleeding after an abdominal injury. LET YOUR CAREGIVERS KNOW ABOUT:  Allergies.  Medications taken including herbs, eye drops, over-the-counter medications, and creams.  Use of steroids (by mouth or creams).  Previous problems with anesthetics or numbing medicine.  Possibility of pregnancy, if this applies.  History of blood clots (thrombophlebitis).  History of bleeding or blood problems.  Previous surgery.  Other health problems. RISKS AND COMPLICATIONS  Injury to an abdominal organ, such as the bowel (large intestine), liver, spleen, or bladder.  Possible infection.  Bleeding.  Low blood pressure (hypotension). BEFORE THE PROCEDURE This is a procedure that can be done as an outpatient. Confirm the time that you need to arrive for your procedure. A blood sample may be done to determine your blood clotting time. The presence of a severe bleeding disorder (coagulopathy) which cannot be promptly corrected may make this procedure inadvisable. You may be asked to urinate. PROCEDURE The procedure will take about 30 minutes. This time will vary depending on the amount of fluid that is removed. You may be asked to lie on your back with your head elevated. An area on your abdomen will be cleansed. A numbing medicine may then be injected (local anesthesia) into the skin and tissue. A needle is inserted through your abdominal skin and tissues until it is positioned in your abdomen. You may feel pressure or slight pain as the needle is positioned into the abdomen. Fluid is removed from the abdomen through the needle. Tell your caregiver if you feel dizzy or lightheaded. The needle is withdrawn once the desired amount of fluid has been removed. A sample of the fluid may be sent for examination.  AFTER THE PROCEDURE Your recovery will be assessed and monitored. If there are no  problems, as an outpatient, you should be able to go home shortly after the procedure. There may be a very limited amount of clear fluid draining from the needle insertion site over the next 2 days. Confirm with your caregiver as to the expected amount of drainage. Obtaining the Test Results It is your responsibility to obtain your test results. Do not assume everything is normal if you have not heard from your caregiver or the medical facility. It is important for you to follow up on all of your test results. HOME CARE INSTRUCTIONS   You may resume normal diet and activities as directed or allowed.  Only take over-the-counter or prescription medicines for pain, discomfort, or fever as directed by your caregiver. SEEK IMMEDIATE MEDICAL CARE IF:  You develop shortness of breath or chest pain.  You develop increasing pain, discomfort, or swelling in your abdomen.  You develop new drainage or pus coming from site where fluid was removed.  You develop swelling or increased redness from site where fluid was removed.  You develop an unexplained temperature of 102 F (38.9 C) or above. Document Released: 07/23/2004 Document Revised: 04/01/2011 Document Reviewed: 08/29/2008 [facility] Patient Information 2015 [place], [place]. This information is not intended to replace advice given to you by your health care provider. Make sure you discuss any questions you have with your health care provider.

## 2014-05-10 NOTE — Procedures (Signed)
Ultrasound-guided diagnostic and therapeutic paracentesis performed yielding 5 liters (maximum ordered) yellow fluid. A portion of the fluid was submitted to the laboratory for preordered studies. No immediate complications. The patient will receive IV albumin infusion post procedure.

## 2014-05-13 LAB — BODY FLUID CULTURE: Culture: NO GROWTH

## 2014-10-22 DEATH — deceased
